# Patient Record
Sex: Female | Born: 1960 | Race: White | Hispanic: No | Smoking: Never smoker
Health system: Southern US, Community
[De-identification: ages and names within clinical notes are randomized; demographics above are authoritative.]

---

## 1997-09-23 ENCOUNTER — Inpatient Hospital Stay (HOSPITAL_COMMUNITY): Admission: AD | Admit: 1997-09-23 | Discharge: 1997-09-25 | Payer: Self-pay | Admitting: *Deleted

## 1997-11-08 ENCOUNTER — Other Ambulatory Visit: Admission: RE | Admit: 1997-11-08 | Discharge: 1997-11-08 | Payer: Self-pay | Admitting: Obstetrics and Gynecology

## 1998-09-18 ENCOUNTER — Other Ambulatory Visit: Admission: RE | Admit: 1998-09-18 | Discharge: 1998-09-18 | Payer: Self-pay | Admitting: *Deleted

## 1999-12-01 ENCOUNTER — Other Ambulatory Visit: Admission: RE | Admit: 1999-12-01 | Discharge: 1999-12-01 | Payer: Self-pay | Admitting: *Deleted

## 2000-07-14 ENCOUNTER — Other Ambulatory Visit: Admission: RE | Admit: 2000-07-14 | Discharge: 2000-07-14 | Payer: Self-pay | Admitting: *Deleted

## 2004-08-19 ENCOUNTER — Ambulatory Visit: Payer: Self-pay

## 2005-09-23 ENCOUNTER — Ambulatory Visit: Payer: Self-pay

## 2006-11-02 ENCOUNTER — Ambulatory Visit: Payer: Self-pay

## 2007-11-03 ENCOUNTER — Ambulatory Visit: Payer: Self-pay

## 2008-11-07 ENCOUNTER — Ambulatory Visit: Payer: Self-pay

## 2009-11-25 ENCOUNTER — Ambulatory Visit: Payer: Self-pay

## 2011-01-26 ENCOUNTER — Ambulatory Visit: Payer: Self-pay

## 2012-01-27 ENCOUNTER — Ambulatory Visit: Payer: Self-pay

## 2013-02-03 ENCOUNTER — Ambulatory Visit: Payer: Self-pay

## 2013-10-16 ENCOUNTER — Ambulatory Visit: Payer: Self-pay | Admitting: Gastroenterology

## 2014-02-05 ENCOUNTER — Ambulatory Visit: Payer: Self-pay

## 2015-02-07 ENCOUNTER — Other Ambulatory Visit: Payer: Self-pay | Admitting: Obstetrics and Gynecology

## 2015-02-07 DIAGNOSIS — Z1231 Encounter for screening mammogram for malignant neoplasm of breast: Secondary | ICD-10-CM

## 2015-02-19 ENCOUNTER — Ambulatory Visit
Admission: RE | Admit: 2015-02-19 | Discharge: 2015-02-19 | Disposition: A | Discharge status: Home / Self Care | Payer: BLUE CROSS/BLUE SHIELD | Source: Ambulatory Visit | Attending: Obstetrics and Gynecology | Admitting: Obstetrics and Gynecology

## 2015-02-19 DIAGNOSIS — Z1231 Encounter for screening mammogram for malignant neoplasm of breast: Secondary | ICD-10-CM | POA: Diagnosis not present

## 2016-01-29 ENCOUNTER — Other Ambulatory Visit: Payer: Self-pay | Admitting: Obstetrics and Gynecology

## 2016-01-29 DIAGNOSIS — Z1231 Encounter for screening mammogram for malignant neoplasm of breast: Secondary | ICD-10-CM

## 2016-02-26 ENCOUNTER — Ambulatory Visit
Admission: RE | Admit: 2016-02-26 | Discharge: 2016-02-26 | Disposition: A | Discharge status: Home / Self Care | Payer: Managed Care, Other (non HMO) | Source: Ambulatory Visit | Attending: Obstetrics and Gynecology | Admitting: Obstetrics and Gynecology

## 2016-02-26 DIAGNOSIS — Z1231 Encounter for screening mammogram for malignant neoplasm of breast: Secondary | ICD-10-CM | POA: Diagnosis present

## 2017-02-11 ENCOUNTER — Other Ambulatory Visit: Payer: Self-pay | Admitting: Obstetrics and Gynecology

## 2017-02-11 DIAGNOSIS — Z1231 Encounter for screening mammogram for malignant neoplasm of breast: Secondary | ICD-10-CM

## 2017-03-02 ENCOUNTER — Ambulatory Visit
Admission: RE | Admit: 2017-03-02 | Discharge: 2017-03-02 | Disposition: A | Discharge status: Home / Self Care | Payer: Managed Care, Other (non HMO) | Source: Ambulatory Visit | Attending: Obstetrics and Gynecology | Admitting: Obstetrics and Gynecology

## 2017-03-02 DIAGNOSIS — Z1231 Encounter for screening mammogram for malignant neoplasm of breast: Secondary | ICD-10-CM | POA: Diagnosis not present

## 2018-02-28 ENCOUNTER — Other Ambulatory Visit: Payer: Self-pay | Admitting: Obstetrics and Gynecology

## 2018-02-28 DIAGNOSIS — Z1231 Encounter for screening mammogram for malignant neoplasm of breast: Secondary | ICD-10-CM

## 2018-03-15 ENCOUNTER — Ambulatory Visit
Admission: RE | Admit: 2018-03-15 | Discharge: 2018-03-15 | Disposition: A | Discharge status: Home / Self Care | Payer: 59 | Source: Ambulatory Visit | Attending: Obstetrics and Gynecology | Admitting: Obstetrics and Gynecology

## 2018-03-15 DIAGNOSIS — Z1231 Encounter for screening mammogram for malignant neoplasm of breast: Secondary | ICD-10-CM | POA: Diagnosis present

## 2018-11-29 ENCOUNTER — Other Ambulatory Visit: Payer: Self-pay | Admitting: Family Medicine

## 2018-11-29 ENCOUNTER — Encounter
Admission: AD | Disposition: A | Discharge status: Home / Self Care | Payer: Self-pay | Source: Ambulatory Visit | Attending: General Surgery

## 2018-11-29 ENCOUNTER — Observation Stay
Admission: AD | Admit: 2018-11-29 | Discharge: 2018-11-30 | Disposition: A | Discharge status: Home / Self Care | Payer: Managed Care, Other (non HMO) | Source: Ambulatory Visit | Attending: General Surgery | Admitting: General Surgery

## 2018-11-29 ENCOUNTER — Ambulatory Visit
Admission: RE | Admit: 2018-11-29 | Discharge: 2018-11-29 | Disposition: A | Discharge status: Home / Self Care | Payer: Managed Care, Other (non HMO) | Source: Ambulatory Visit | Attending: Family Medicine | Admitting: Family Medicine

## 2018-11-29 ENCOUNTER — Observation Stay: Payer: Managed Care, Other (non HMO) | Admitting: Anesthesiology

## 2018-11-29 ENCOUNTER — Ambulatory Visit: Admit: 2018-11-29 | Payer: Managed Care, Other (non HMO) | Admitting: General Surgery

## 2018-11-29 ENCOUNTER — Other Ambulatory Visit: Payer: Self-pay

## 2018-11-29 DIAGNOSIS — R1030 Lower abdominal pain, unspecified: Secondary | ICD-10-CM | POA: Insufficient documentation

## 2018-11-29 DIAGNOSIS — K219 Gastro-esophageal reflux disease without esophagitis: Secondary | ICD-10-CM | POA: Diagnosis not present

## 2018-11-29 DIAGNOSIS — R109 Unspecified abdominal pain: Secondary | ICD-10-CM | POA: Diagnosis present

## 2018-11-29 DIAGNOSIS — F418 Other specified anxiety disorders: Secondary | ICD-10-CM | POA: Diagnosis not present

## 2018-11-29 DIAGNOSIS — K3580 Unspecified acute appendicitis: Secondary | ICD-10-CM | POA: Diagnosis not present

## 2018-11-29 DIAGNOSIS — Z1159 Encounter for screening for other viral diseases: Secondary | ICD-10-CM | POA: Insufficient documentation

## 2018-11-29 DIAGNOSIS — K353 Acute appendicitis with localized peritonitis, without perforation or gangrene: Secondary | ICD-10-CM | POA: Diagnosis present

## 2018-11-29 HISTORY — PX: LAPAROSCOPIC APPENDECTOMY: SHX408

## 2018-11-29 LAB — SARS CORONAVIRUS 2 BY RT PCR (HOSPITAL ORDER, PERFORMED IN ~~LOC~~ HOSPITAL LAB): SARS Coronavirus 2: NEGATIVE

## 2018-11-29 LAB — MRSA PCR SCREENING: MRSA by PCR: NEGATIVE

## 2018-11-29 SURGERY — APPENDECTOMY, LAPAROSCOPIC
Anesthesia: General

## 2018-11-29 MED ORDER — ROCURONIUM BROMIDE 100 MG/10ML IV SOLN
INTRAVENOUS | Status: DC | PRN
  Administered 2018-11-29: 10 mg via INTRAVENOUS
  Administered 2018-11-29: 20 mg via INTRAVENOUS

## 2018-11-29 MED ORDER — DEXAMETHASONE SODIUM PHOSPHATE 10 MG/ML IJ SOLN
INTRAMUSCULAR | Status: DC | PRN
  Administered 2018-11-29: 10 mg via INTRAVENOUS

## 2018-11-29 MED ORDER — METRONIDAZOLE IN NACL 5-0.79 MG/ML-% IV SOLN
500.0000 mg | Freq: Three times a day (TID) | INTRAVENOUS | Status: DC
  Administered 2018-11-29 – 2018-11-30 (×2): 500 mg via INTRAVENOUS
  Filled 2018-11-29 (×4): qty 100

## 2018-11-29 MED ORDER — ALPRAZOLAM 0.25 MG PO TABS: 0.2500 mg | ORAL_TABLET | Freq: Every evening | ORAL | Status: DC | PRN

## 2018-11-29 MED ORDER — LIDOCAINE HCL (CARDIAC) PF 100 MG/5ML IV SOSY
PREFILLED_SYRINGE | INTRAVENOUS | Status: DC | PRN
  Administered 2018-11-29: 100 mg via INTRAVENOUS

## 2018-11-29 MED ORDER — MIDAZOLAM HCL 2 MG/2ML IJ SOLN
INTRAMUSCULAR | Status: AC
  Filled 2018-11-29: qty 2

## 2018-11-29 MED ORDER — GLYCOPYRROLATE 0.2 MG/ML IJ SOLN
INTRAMUSCULAR | Status: DC | PRN
  Administered 2018-11-29: 0.2 mg via INTRAVENOUS

## 2018-11-29 MED ORDER — ROCURONIUM BROMIDE 50 MG/5ML IV SOLN
INTRAVENOUS | Status: AC
  Filled 2018-11-29: qty 1

## 2018-11-29 MED ORDER — ONDANSETRON HCL 4 MG/2ML IJ SOLN
INTRAMUSCULAR | Status: AC
  Filled 2018-11-29: qty 2

## 2018-11-29 MED ORDER — LIDOCAINE HCL (PF) 2 % IJ SOLN
INTRAMUSCULAR | Status: AC
  Filled 2018-11-29: qty 10

## 2018-11-29 MED ORDER — SUGAMMADEX SODIUM 500 MG/5ML IV SOLN
INTRAVENOUS | Status: DC | PRN
  Administered 2018-11-29: 140 mg via INTRAVENOUS

## 2018-11-29 MED ORDER — PAROXETINE HCL ER 12.5 MG PO TB24
25.0000 mg | ORAL_TABLET | Freq: Every day | ORAL | Status: DC
  Filled 2018-11-29 (×2): qty 2

## 2018-11-29 MED ORDER — PROPOFOL 10 MG/ML IV BOLUS
INTRAVENOUS | Status: DC | PRN
  Administered 2018-11-29: 140 mg via INTRAVENOUS

## 2018-11-29 MED ORDER — SUGAMMADEX SODIUM 200 MG/2ML IV SOLN
INTRAVENOUS | Status: AC
  Filled 2018-11-29: qty 2

## 2018-11-29 MED ORDER — ONDANSETRON 4 MG PO TBDP: 4.0000 mg | ORAL_TABLET | Freq: Four times a day (QID) | ORAL | Status: DC | PRN

## 2018-11-29 MED ORDER — PROPOFOL 10 MG/ML IV BOLUS
INTRAVENOUS | Status: AC
  Filled 2018-11-29: qty 20

## 2018-11-29 MED ORDER — ACETAMINOPHEN 10 MG/ML IV SOLN
INTRAVENOUS | Status: AC
  Filled 2018-11-29: qty 100

## 2018-11-29 MED ORDER — ACETAMINOPHEN 10 MG/ML IV SOLN
INTRAVENOUS | Status: DC | PRN
  Administered 2018-11-29: 1000 mg via INTRAVENOUS

## 2018-11-29 MED ORDER — SODIUM CHLORIDE 0.9 % IV SOLN
INTRAVENOUS | Status: DC
  Administered 2018-11-29: 19:00:00 via INTRAVENOUS

## 2018-11-29 MED ORDER — FENTANYL CITRATE (PF) 100 MCG/2ML IJ SOLN
INTRAMUSCULAR | Status: AC
  Filled 2018-11-29: qty 2

## 2018-11-29 MED ORDER — SEVOFLURANE IN SOLN
RESPIRATORY_TRACT | Status: AC
  Filled 2018-11-29: qty 250

## 2018-11-29 MED ORDER — ACETAMINOPHEN 650 MG RE SUPP: 650.0000 mg | Freq: Four times a day (QID) | RECTAL | Status: DC | PRN

## 2018-11-29 MED ORDER — FENTANYL CITRATE (PF) 100 MCG/2ML IJ SOLN
INTRAMUSCULAR | Status: DC | PRN
  Administered 2018-11-29 (×2): 50 ug via INTRAVENOUS

## 2018-11-29 MED ORDER — SUCCINYLCHOLINE CHLORIDE 20 MG/ML IJ SOLN
INTRAMUSCULAR | Status: DC | PRN
  Administered 2018-11-29: 100 mg via INTRAVENOUS

## 2018-11-29 MED ORDER — HYDROCODONE-ACETAMINOPHEN 5-325 MG PO TABS
1.0000 | ORAL_TABLET | ORAL | Status: DC | PRN
  Administered 2018-11-29 – 2018-11-30 (×3): 2 via ORAL
  Filled 2018-11-29 (×3): qty 2

## 2018-11-29 MED ORDER — ENOXAPARIN SODIUM 40 MG/0.4ML ~~LOC~~ SOLN: 40.0000 mg | Freq: Every day | SUBCUTANEOUS | Status: DC

## 2018-11-29 MED ORDER — MORPHINE SULFATE (PF) 2 MG/ML IV SOLN: 2.0000 mg | INTRAVENOUS | Status: DC | PRN

## 2018-11-29 MED ORDER — ONDANSETRON HCL 4 MG/2ML IJ SOLN
INTRAMUSCULAR | Status: DC | PRN
  Administered 2018-11-29: 4 mg via INTRAVENOUS

## 2018-11-29 MED ORDER — ACETAMINOPHEN 325 MG PO TABS
650.0000 mg | ORAL_TABLET | Freq: Four times a day (QID) | ORAL | Status: DC | PRN
  Administered 2018-11-30: 09:00:00 650 mg via ORAL
  Filled 2018-11-29: qty 2

## 2018-11-29 MED ORDER — SODIUM CHLORIDE 0.9 % IV SOLN
2.0000 g | Freq: Every day | INTRAVENOUS | Status: DC
  Administered 2018-11-29: 19:00:00 2 g via INTRAVENOUS
  Filled 2018-11-29: qty 2
  Filled 2018-11-29: qty 20

## 2018-11-29 MED ORDER — LACTATED RINGERS IV SOLN
INTRAVENOUS | Status: DC | PRN
  Administered 2018-11-29: 21:00:00 via INTRAVENOUS

## 2018-11-29 MED ORDER — ONDANSETRON HCL 4 MG/2ML IJ SOLN: 4.0000 mg | Freq: Four times a day (QID) | INTRAMUSCULAR | Status: DC | PRN

## 2018-11-29 MED ORDER — IOHEXOL 300 MG/ML  SOLN
100.0000 mL | Freq: Once | INTRAMUSCULAR | Status: AC | PRN
  Administered 2018-11-29: 100 mL via INTRAVENOUS

## 2018-11-29 MED ORDER — BUPIVACAINE-EPINEPHRINE 0.5% -1:200000 IJ SOLN
INTRAMUSCULAR | Status: DC | PRN
  Administered 2018-11-29: 17 mL
  Administered 2018-11-29: 13 mL

## 2018-11-29 MED ORDER — SUCCINYLCHOLINE CHLORIDE 20 MG/ML IJ SOLN
INTRAMUSCULAR | Status: AC
  Filled 2018-11-29: qty 1

## 2018-11-29 SURGICAL SUPPLY — 45 items
ADH SKN CLS APL DERMABOND .7 (GAUZE/BANDAGES/DRESSINGS) ×1
APL PRP STRL LF DISP 70% ISPRP (MISCELLANEOUS) ×1
APPLIER CLIP LOGIC TI 5 (MISCELLANEOUS) IMPLANT
APR CLP MED LRG 33X5 (MISCELLANEOUS)
BLADE SURG SZ11 CARB STEEL (BLADE) ×3 IMPLANT
CANISTER SUCT 1200ML W/VALVE (MISCELLANEOUS) ×3 IMPLANT
CHLORAPREP W/TINT 26 (MISCELLANEOUS) ×3 IMPLANT
COVER WAND RF STERILE (DRAPES) ×1 IMPLANT
CUTTER FLEX LINEAR 45M (STAPLE) ×3 IMPLANT
DERMABOND ADVANCED (GAUZE/BANDAGES/DRESSINGS) ×2
DERMABOND ADVANCED .7 DNX12 (GAUZE/BANDAGES/DRESSINGS) ×1 IMPLANT
ELECT REM PT RETURN 9FT ADLT (ELECTROSURGICAL) ×3
ELECTRODE REM PT RTRN 9FT ADLT (ELECTROSURGICAL) ×1 IMPLANT
GLOVE BIO SURGEON STRL SZ 6.5 (GLOVE) ×3 IMPLANT
GLOVE BIO SURGEONS STRL SZ 6.5 (GLOVE) ×2
GLOVE INDICATOR 6.5 STRL GRN (GLOVE) ×5 IMPLANT
GOWN STRL REUS W/ TWL LRG LVL3 (GOWN DISPOSABLE) ×3 IMPLANT
GOWN STRL REUS W/TWL LRG LVL3 (GOWN DISPOSABLE) ×6
GRASPER SUT TROCAR 14GX15 (MISCELLANEOUS) ×3 IMPLANT
HANDLE YANKAUER SUCT BULB TIP (MISCELLANEOUS) ×3 IMPLANT
IRRIGATION STRYKERFLOW (MISCELLANEOUS) IMPLANT
IRRIGATOR STRYKERFLOW (MISCELLANEOUS) ×3
IV NS 1000ML (IV SOLUTION) ×6
IV NS 1000ML BAXH (IV SOLUTION) ×1 IMPLANT
KIT TURNOVER KIT A (KITS) ×3 IMPLANT
LIGASURE LAP MARYLAND 5MM 37CM (ELECTROSURGICAL) ×2 IMPLANT
NEEDLE HYPO 22GX1.5 SAFETY (NEEDLE) ×3 IMPLANT
NEEDLE VERESS 14GA 120MM (NEEDLE) ×3 IMPLANT
NS IRRIG 500ML POUR BTL (IV SOLUTION) ×3 IMPLANT
PACK LAP CHOLECYSTECTOMY (MISCELLANEOUS) ×3 IMPLANT
POUCH ENDO CATCH 10MM SPEC (MISCELLANEOUS) ×3 IMPLANT
RELOAD 45 VASCULAR/THIN (ENDOMECHANICALS) IMPLANT
RELOAD STAPLE 45 2.5 WHT GRN (ENDOMECHANICALS) IMPLANT
RELOAD STAPLE 45 3.5 BLU ETS (ENDOMECHANICALS) ×1 IMPLANT
RELOAD STAPLE TA45 3.5 REG BLU (ENDOMECHANICALS) ×3 IMPLANT
SCISSORS METZENBAUM CVD 33 (INSTRUMENTS) ×1 IMPLANT
SET TUBE SMOKE EVAC HIGH FLOW (TUBING) ×3 IMPLANT
SLEEVE ENDOPATH XCEL 5M (ENDOMECHANICALS) ×3 IMPLANT
SPONGE GAUZE 2X2 8PLY STER LF (GAUZE/BANDAGES/DRESSINGS) ×1
SPONGE GAUZE 2X2 8PLY STRL LF (GAUZE/BANDAGES/DRESSINGS) ×2 IMPLANT
SUT MNCRL AB 4-0 PS2 18 (SUTURE) ×3 IMPLANT
SUT VICRYL PLUS ABS 0 54 (SUTURE) ×3 IMPLANT
TRAY FOLEY MTR SLVR 16FR STAT (SET/KITS/TRAYS/PACK) ×3 IMPLANT
TROCAR XCEL 12X100 BLDLESS (ENDOMECHANICALS) ×3 IMPLANT
TROCAR XCEL NON-BLD 5MMX100MML (ENDOMECHANICALS) ×3 IMPLANT

## 2018-11-29 NOTE — H&P (Signed)
PATIENT PROFILE: Jillian Hunter is a 57 y.o. female who presents to the Clinic for consultation at the request of Dr. Linthavong for evaluation of acute appendicitis.  PCP:  Linthavong, [place], MD  HISTORY OF PRESENT ILLNESS: Ms. Jillian Hunter reports he started with abdominal pain 2 days ago.  The pain was localized on the right lower quadrant.  The pain did not radiate to other part of the body.  There was no alleviating or aggravating factor.  Patient was very uncomfortable and was not able to find position to improve the pain.  Yesterday and today the pain slightly improved but has been on the right lower quadrant of the time.  She reports nausea but denies vomiting.  She denies fever or chills.   PROBLEM LIST: Problem List  Date Reviewed: 11/29/2018         Noted   Borderline diabetes mellitus (A1c 5.9% - 09/02/18) - diet controlled 09/19/2018   B12 deficiency (324 - 09/02/18) 09/19/2018   Pure hypercholesterolemia (LDL 139 - 09/02/18) - diet controlled 09/17/2017   Vaccine counseling: Tetanus vaccine administered in 2011 (09/16/17) 09/12/2015   Depression with anxiety - meds prescribed by Dr. Schermerhorn 08/31/2013      GENERAL REVIEW OF SYSTEMS:   General ROS: negative for - chills, fatigue, fever, weight gain or weight loss Allergy and Immunology ROS: negative for - hives  Hematological and Lymphatic ROS: negative for - bleeding problems or bruising, negative for palpable nodes Endocrine ROS: negative for - heat or cold intolerance, hair changes Respiratory ROS: negative for - cough, shortness of breath or wheezing Cardiovascular ROS: no chest pain or palpitations GI ROS: negative for vomiting, diarrhea, constipation.  Setting for abdominal pain and nausea. Musculoskeletal ROS: negative for - joint swelling or muscle pain Neurological ROS: negative for - confusion, syncope Dermatological ROS: negative for pruritus and rash Psychiatric: negative for anxiety, depression, difficulty sleeping and  memory loss  MEDICATIONS: Current Outpatient Medications  Medication Sig Dispense Refill  . ALPRAZolam (XANAX) 0.25 MG tablet Take 1 tablet (0.25 mg total) by mouth nightly as needed 30 tablet 0  . cyanocobalamin (VITAMIN B12) 1000 MCG tablet Take 1,000 mcg by mouth once daily    . docusate sodium (STOOL SOFTENER ORAL) Take 1 capsule by mouth every other day    . IBUPROFEN/DIPHENHYDRAMINE CIT (IBUPROFEN PM ORAL) Take 2 tablets by mouth nightly as needed.    . magnesium 250 mg Tab Take by mouth once daily    . multivitamin tablet Take 1 tablet by mouth once daily    . PARoxetine (PAXIL-CR) 25 MG CR tablet Take 1 tablet (25 mg total) by mouth once daily 90 tablet 3   No current facility-administered medications for this visit.     ALLERGIES: Patient has no known allergies.  PAST MEDICAL HISTORY: Past Medical History:  Diagnosis Date  . Abnormal cytology   . Anxiety   . Depression   . Papanicolaou smear of cervix with low grade squamous intraepithelial lesion (LGSIL) 12/29/2011   Colposcopy 01/15/2012 = benign  . Pure hypercholesterolemia (LDL 157 - 09/17/17)  09/17/2017    PAST SURGICAL HISTORY: History reviewed. No pertinent surgical history.   FAMILY HISTORY: Family History  Problem Relation Age of Onset  . Myocardial Infarction (Heart attack) Father   . Alcohol abuse Father   . Osteoporosis (Thinning of bones) Mother   . Stroke Mother      SOCIAL HISTORY: Social History   Socioeconomic History  . Marital status: Married      Spouse name: Not on file  . Number of children: Not on file  . Years of education: Not on file  . Highest education level: Not on file  Occupational History  . Not on file  Social Needs  . Financial resource strain: Not on file  . Food insecurity:    Worry: Not on file    Inability: Not on file  . Transportation needs:    Medical: Not on file    Non-medical: Not on file  Tobacco Use  . Smoking status: Never Smoker  . Smokeless tobacco:  Never Used  Substance and Sexual Activity  . Alcohol use: Yes    Alcohol/week: 0.0 standard drinks    Comment: occasional  . Drug use: No  . Sexual activity: Not Currently    Partners: Male    Birth control/protection: Post-menopausal  Other Topics Concern  . Not on file  Social History Narrative  . Not on file    PHYSICAL EXAM: Vitals:   11/29/18 1604  BP: 156/73  Pulse: 81   Body mass index is 24.3 kg/m. Weight: 66.2 kg (146 lb)   GENERAL: Alert, active, oriented x3  HEENT: Pupils equal reactive to light. Extraocular movements are intact. Sclera clear. Palpebral conjunctiva normal red color.  NECK: Supple with no palpable mass and no adenopathy.  LUNGS: Sound clear with no rales rhonchi or wheezes.  HEART: Regular rhythm S1 and S2 without murmur.  ABDOMEN: Tender to palpation in the right lower quadrant.  Soft and depressible, nondistended.  No scars.  EXTREMITIES: Well-developed well-nourished symmetrical with no dependent edema.  NEUROLOGICAL: Awake alert oriented, facial expression symmetrical, moving all extremities.  REVIEW OF DATA: I have reviewed the following data today: Office Visit on 11/29/2018  Component Date Value  . WBC (White Blood Cell Co* 11/29/2018 6.3   . RBC (Red Blood Cell Coun* 11/29/2018 4.42   . Hemoglobin 11/29/2018 13.6   . Hematocrit 11/29/2018 40.9   . MCV (Mean Corpuscular Vo* 11/29/2018 92.5   . MCH (Mean Corpuscular He* 11/29/2018 30.8   . MCHC (Mean Corpuscular H* 11/29/2018 33.3   . Platelet Count 11/29/2018 258   . RDW-CV (Red Cell Distrib* 11/29/2018 11.9   . MPV (Mean Platelet Volum* 11/29/2018 10.1   . Neutrophils 11/29/2018 3.66   . Lymphocytes 11/29/2018 1.93   . Monocytes 11/29/2018 0.46   . Eosinophils 11/29/2018 0.17   . Basophils 11/29/2018 0.04   . Neutrophil % 11/29/2018 58.4   . Lymphocyte % 11/29/2018 30.8   . Monocyte % 11/29/2018 7.3   . Eosinophil % 11/29/2018 2.7   . Basophil% 11/29/2018 0.6   .  Immature Granulocyte % 11/29/2018 0.2   . Immature Granulocyte Cou* 11/29/2018 0.01   . Glucose 11/29/2018 95   . Sodium 11/29/2018 141   . Potassium 11/29/2018 3.5*  . Chloride 11/29/2018 103   . Carbon Dioxide (CO2) 11/29/2018 29.4   . Urea Nitrogen (BUN) 11/29/2018 11   . Creatinine 11/29/2018 0.7   . Glomerular Filtration Ra* 11/29/2018 87   . Calcium 11/29/2018 9.5   . AST  11/29/2018 20   . ALT  11/29/2018 18   . Alk Phos (alkaline Phosp* 11/29/2018 81   . Albumin 11/29/2018 4.5   . Bilirubin, Total 11/29/2018 0.6   . Protein, Total 11/29/2018 7.3   . A/G Ratio 11/29/2018 1.6   . Color 11/29/2018 Yellow   . Clarity 11/29/2018 Clear   . Specific Gravity 11/29/2018 1.015   . pH, Urine 11/29/2018   6.0   . Protein, Urinalysis 11/29/2018 Negative   . Glucose, Urinalysis 11/29/2018 Negative   . Ketones, Urinalysis 11/29/2018 Trace*  . Blood, Urinalysis 11/29/2018 Negative   . Nitrite, Urinalysis 11/29/2018 Negative   . Leukocyte Esterase, Urin* 11/29/2018 Small*  . White Blood Cells, Urina* 11/29/2018 0-3   . Red Blood Cells, Urinaly* 11/29/2018 0-3   . Bacteria, Urinalysis 11/29/2018 Rare*  . Squamous Epithelial Cell* 11/29/2018 Few   Appointment on 09/02/2018  Component Date Value  . Glucose 09/02/2018 103   . Sodium 09/02/2018 143   . Potassium 09/02/2018 3.7   . Chloride 09/02/2018 105   . Carbon Dioxide (CO2) 09/02/2018 27.1   . Urea Nitrogen (BUN) 09/02/2018 7   . Creatinine 09/02/2018 0.7   . Glomerular Filtration Ra* 09/02/2018 87   . Calcium 09/02/2018 9.5   . AST  09/02/2018 19   . ALT  09/02/2018 16   . Alk Phos (alkaline Phosp* 09/02/2018 62   . Albumin 09/02/2018 4.3   . Bilirubin, Total 09/02/2018 0.7   . Protein, Total 09/02/2018 7.0   . A/G Ratio 09/02/2018 1.6   . Hemoglobin A1C 09/02/2018 5.9*  . Average Blood Glucose (C* 09/02/2018 123   . Cholesterol, Total 09/02/2018 214*  . Triglyceride 09/02/2018 137   . HDL (High Density Lipopr* 09/02/2018  48.0   . LDL Calculated 09/02/2018 139*  . VLDL Cholesterol 09/02/2018 27   . Cholesterol/HDL Ratio 09/02/2018 4.5   . WBC (White Blood Cell Co* 09/02/2018 5.3   . RBC (Red Blood Cell Coun* 09/02/2018 4.40   . Hemoglobin 09/02/2018 13.4   . Hematocrit 09/02/2018 40.9   . MCV (Mean Corpuscular Vo* 09/02/2018 93.0   . MCH (Mean Corpuscular He* 09/02/2018 30.5   . MCHC (Mean Corpuscular H* 09/02/2018 32.8   . Platelet Count 09/02/2018 281   . RDW-CV (Red Cell Distrib* 09/02/2018 12.0   . MPV (Mean Platelet Volum* 09/02/2018 10.1   . Neutrophils 09/02/2018 2.60   . Lymphocytes 09/02/2018 2.11   . Monocytes 09/02/2018 0.36   . Eosinophils 09/02/2018 0.17   . Basophils 09/02/2018 0.04   . Neutrophil % 09/02/2018 49.1   . Lymphocyte % 09/02/2018 39.9   . Monocyte % 09/02/2018 6.8   . Eosinophil % 09/02/2018 3.2   . Basophil% 09/02/2018 0.8   . Immature Granulocyte % 09/02/2018 0.2   . Immature Granulocyte Cou* 09/02/2018 0.01   . Vitamin B12 09/02/2018 324      ASSESSMENT: Ms. Touchet is a 57 y.o. female presenting for consultation for acute appendicitis.   Patient with history, physical exam and images consistent with acute appendicitis. Patient oriented about diagnosis and surgical management as treatment. Patient oriented about goals of surgery and its risk including: bowel injury, infection, abscess, bleeding, leak from cecum, intestinal adhesions, bowel obstruction, fistula, injury to the ureter among others.  Patient understood and agreed to proceed with surgery. Will admit patient, already started on antibiotic therapy, will give IV hydration since patient is NPO and schedule to OR.    Acute appendicitis with localized peritonitis, without perforation, abscess, or gangrene [K35.30]  PLAN: 1. Admit to hospital for laparoscopic appendectomy  Patient and her sister verbalized understanding, all questions were answered, and were agreeable with the plan outlined above.   Edgardo  Cintron-Diaz, MD  Electronically signed by Edgardo Cintron-Diaz, MD  

## 2018-11-29 NOTE — Anesthesia Preprocedure Evaluation (Signed)
Anesthesia Evaluation  Patient identified by MRN, date of birth, ID band Patient awake    Reviewed: Allergy & Precautions, H&P , NPO status , Patient's Chart, lab work & pertinent test results  History of Anesthesia Complications Negative for: history of anesthetic complications  Airway Mallampati: III  TM Distance: >3 FB Neck ROM: full    Dental  (+) Chipped   Pulmonary neg pulmonary ROS, neg shortness of breath,           Cardiovascular Exercise Tolerance: Good (-) angina(-) Past MI and (-) DOE negative cardio ROS       Neuro/Psych negative neurological ROS  negative psych ROS   GI/Hepatic Neg liver ROS, GERD  Medicated,  Endo/Other  negative endocrine ROS  Renal/GU      Musculoskeletal   Abdominal   Peds  Hematology negative hematology ROS (+)   Anesthesia Other Findings   BMI    Body Mass Index: 24.91 kg/m      Reproductive/Obstetrics negative OB ROS                             Anesthesia Physical Anesthesia Plan  ASA: II  Anesthesia Plan: General ETT   Post-op Pain Management:    Induction: Intravenous  PONV Risk Score and Plan: Ondansetron, Dexamethasone, Midazolam and Treatment may vary due to age or medical condition  Airway Management Planned: Oral ETT  Additional Equipment:   Intra-op Plan:   Post-operative Plan: Extubation in OR  Informed Consent: I have reviewed the patients History and Physical, chart, labs and discussed the procedure including the risks, benefits and alternatives for the proposed anesthesia with the patient or authorized representative who has indicated his/her understanding and acceptance.     Dental Advisory Given  Plan Discussed with: Anesthesiologist, CRNA and Surgeon  Anesthesia Plan Comments: (Patient consented for risks of anesthesia including but not limited to:  - adverse reactions to medications - damage to teeth, lips  or other oral mucosa - sore throat or hoarseness - Damage to heart, brain, lungs or loss of life  Patient voiced understanding.)        Anesthesia Quick Evaluation

## 2018-11-29 NOTE — Anesthesia Procedure Notes (Signed)
Procedure Name: Intubation Date/Time: 11/29/2018 9:15 PM Performed by: Lendon Colonel, CRNA Pre-anesthesia Checklist: Patient identified, Patient being monitored, Timeout performed, Emergency Drugs available and Suction available Patient Re-evaluated:Patient Re-evaluated prior to induction Oxygen Delivery Method: Circle system utilized Preoxygenation: Pre-oxygenation with 100% oxygen Induction Type: IV induction and Rapid sequence Laryngoscope Size: Miller and 2 Grade View: Grade I Tube type: Oral Tube size: 7.0 mm Number of attempts: 1 Airway Equipment and Method: Stylet Placement Confirmation: ETT inserted through vocal cords under direct vision,  positive ETCO2 and breath sounds checked- equal and bilateral Secured at: 20 cm Tube secured with: Tape Dental Injury: Teeth and Oropharynx as per pre-operative assessment

## 2018-11-29 NOTE — Op Note (Signed)
Preoperative diagnosis: Acute appendicitis.  Postoperative diagnosis: Acute appendicitis  Procedure: Laparoscopic appendectomy.  Anesthesia: GETA  Surgeon: Dr. Windell Moment, MD  Wound Classification: Contaminated  Indications: Patient is a 58 y.o. female  presented with right lower quadrant pain of 2 days of duration, chills. Computed tomography scan and physical examination were consistent with acute appendicitis.   Findings: 1. Acutely inflamed appendix 2. No peri-appendiceal abscess or phlegmon 3. Normal anatomy 4. Adequate hemostasis.   Description of procedure: The patient was placed on the operating table in the supine position. General anesthesia was induced. A time-out was completed verifying correct patient, procedure, site, positioning, and implant(s) and/or special equipment prior to beginning this procedure. A Foley catheter and orogastric tubes were placed. The abdomen was prepped and draped in the usual sterile fashion.  An incision was made in a natural skin line above the umbilicus.   The fascia was elevated and the Veress needle inserted. Proper position was confirmed by aspiration and saline meniscus test. The abdomen was insufflated with carbon dioxide to a pressure of 15 mmHg. The patient tolerated insufflation well. A 5-mm optiview trocar was then inserted supraumbilically. The laparoscope was inserted and the abdomen inspected. No injuries from initial trocar placement were noted. Turbid fluid was noted in the right lower quadrant. Under direct visualization, an 12-mm trocar was inserted in the left lower quadrant lateral to the rectus muscle. A 5-mm port was then placed above the symphysis pubis on midline.  Care was taken to avoid injury to the bladder or inferior epigastric vessels. The table was placed in the Trendelenburg position with the right side elevated.  The cecum was gently grasped with an endoscopic graspers and pulled toward (the left upper quadrant). An  atraumatic grasper was then passed through the suprapubic port and omentum was dissected away until the appendix was identified. The appendix was then grasped and elevated. It was noted to be inflamed.  An endoscopic linear cutting stapler was then used to divide and staple the base of the appendix.  The mesoappendix was divided with the LigaSure device.  The appendix was placed in an endoscopic retrieval bag and removed.  The appendiceal stump was then irrigated and hemostasis was assured. Fluid was suctioned and no other pathology was identified.  Secondary trocars were removed under direct vision. No bleeding was noted. The laparoscope was withdrawn and the umbilical trocar removed. The abdomen was allowed to collapse. All trocar sites greater than 5 mm were closed with Vicryl 0. The skin was closed with subcuticular sutures Monocryl 3-0 of and steristrips.  The patient tolerated the procedure well and was taken to the postanesthesia care unit in satisfactory condition.   Specimen: Appendix  Complications: None  Estimated Blood Loss: 5 mL

## 2018-11-29 NOTE — Transfer of Care (Signed)
Immediate Anesthesia Transfer of Care Note  Patient: Jillian Hunter  Procedure(s) Performed: APPENDECTOMY LAPAROSCOPIC (N/A )  Patient Location: PACU  Anesthesia Type:General  Level of Consciousness: awake, alert , oriented and patient cooperative  Airway & Oxygen Therapy: Patient Spontanous Breathing and Patient connected to face mask oxygen  Post-op Assessment: Report given to RN and Post -op Vital signs reviewed and stable  Post vital signs: Reviewed and stable  Last Vitals:  Vitals Value Taken Time  BP 119/90 11/29/18 2217  Temp    Pulse 99 11/29/18 2219  Resp 17 11/29/18 2219  SpO2 100 % 11/29/18 2219  Vitals shown include unvalidated device data.  Last Pain:  Vitals:   11/29/18 2048  TempSrc:   PainSc: 1          Complications: No apparent anesthesia complications

## 2018-11-29 NOTE — Anesthesia Post-op Follow-up Note (Signed)
Anesthesia QCDR form completed.        

## 2018-11-30 ENCOUNTER — Encounter: Payer: Self-pay | Admitting: General Surgery

## 2018-11-30 DIAGNOSIS — K3580 Unspecified acute appendicitis: Secondary | ICD-10-CM | POA: Diagnosis not present

## 2018-11-30 MED ORDER — HYDROCODONE-ACETAMINOPHEN 5-325 MG PO TABS: 1.0000 | ORAL_TABLET | ORAL | 0 refills | Status: AC | PRN

## 2018-11-30 NOTE — Progress Notes (Signed)
Nsg Discharge Note  Admit Date:  11/29/2018 Discharge date: 11/30/2018   Otelia Santee to be D/C'd Home per MD order.  AVS completed.  Copy for chart, and copy for patient signed, and dated. Patient/caregiver able to verbalize understanding.  Discharge Medication: Allergies as of 11/30/2018   No Known Allergies     Medication List    TAKE these medications   HYDROcodone-acetaminophen 5-325 MG tablet Commonly known as: Norco Take 1 tablet by mouth every 4 (four) hours as needed for up to 3 days for moderate pain.       Discharge Assessment: Vitals:   11/30/18 0047 11/30/18 0415  BP: (!) 107/59 115/62  Pulse: 61 63  Resp: 18   Temp: 98.3 F (36.8 C) (!) 97.5 F (36.4 C)  SpO2: 98% 98%   Skin clean, dry and intact without evidence of skin break down, no evidence of skin tears noted. IV catheter discontinued intact. Site without signs and symptoms of complications - no redness or edema noted at insertion site, patient denies c/o pain - only slight tenderness at site.  Dressing with slight pressure applied.  D/c Instructions-Education: Discharge instructions given to patient/family with verbalized understanding. D/c education completed with patient/family including follow up instructions, medication list, d/c activities limitations if indicated, with other d/c instructions as indicated by MD - patient able to verbalize understanding, all questions fully answered. Patient instructed to return to ED, call 911, or call MD for any changes in condition.  Patient escorted via [place], and D/C home via private auto.  Eda Keys, RN 11/30/2018 11:17 AM

## 2018-11-30 NOTE — Discharge Summary (Signed)
  Patient ID: Jillian Hunter MRN: [id number] DOB/AGE: 01-11-1961 58 y.o.  Admit date: 11/29/2018 Discharge date: 11/30/2018   Discharge Diagnoses:  Active Problems:   Acute appendicitis with localized peritonitis   Procedures: Laparoscopic appendectomy  Hospital Course: Patient admitted with acute appendicitis.  Patient underwent laparoscopic appendectomy last night.  She tolerated the procedure well.  Today patient has had breakfast, passing gas, pain control and ambulating.  She does not have any issues with the wound.  Physical Exam  Constitutional: She is well-developed, well-nourished, and in no distress.  Cardiovascular: Normal rate and regular rhythm.  Pulmonary/Chest: Effort normal.  Abdominal: Soft. She exhibits no distension. There is no abdominal tenderness.  Wounds are dry and clean.  Small bruise on umbilical and left lower quadrant wound.   Consults: None  Disposition: Discharge disposition: 01-Home or Self Care       Discharge Instructions    Diet - low sodium heart healthy   Complete by: As directed      Allergies as of 11/30/2018   No Known Allergies     Medication List    TAKE these medications   HYDROcodone-acetaminophen 5-325 MG tablet Commonly known as: Norco Take 1 tablet by mouth every 4 (four) hours as needed for up to 3 days for moderate pain.      Follow-up Information    Herbert Pun, MD Follow up in 2 week(s).   Specialty: General Surgery Contact information: [address] [place] [address] [phone]

## 2018-11-30 NOTE — Discharge Instructions (Signed)

## 2018-12-01 LAB — SURGICAL PATHOLOGY

## 2018-12-01 NOTE — Anesthesia Postprocedure Evaluation (Signed)
Anesthesia Post Note  Patient: [place]  Procedure(s) Performed: APPENDECTOMY LAPAROSCOPIC (N/A )  Patient location during evaluation: PACU Anesthesia Type: General Level of consciousness: awake and alert Pain management: pain level controlled Vital Signs Assessment: post-procedure vital signs reviewed and stable Respiratory status: spontaneous breathing, nonlabored ventilation, respiratory function stable and patient connected to nasal cannula oxygen Cardiovascular status: blood pressure returned to baseline and stable Postop Assessment: no apparent nausea or vomiting Anesthetic complications: no     Last Vitals:  Vitals:   11/30/18 0047 11/30/18 0415  BP: (!) 107/59 115/62  Pulse: 61 63  Resp: 18   Temp: 36.8 C (!) 36.4 C  SpO2: 98% 98%    Last Pain:  Vitals:   11/30/18 0602  TempSrc:   PainSc: Asleep                 Precious Haws Leveta Wahab

## 2019-02-28 ENCOUNTER — Other Ambulatory Visit: Payer: Self-pay | Admitting: Obstetrics and Gynecology

## 2019-02-28 DIAGNOSIS — Z1231 Encounter for screening mammogram for malignant neoplasm of breast: Secondary | ICD-10-CM

## 2019-03-08 DIAGNOSIS — E7800 Pure hypercholesterolemia, unspecified: Secondary | ICD-10-CM | POA: Insufficient documentation

## 2019-04-13 ENCOUNTER — Ambulatory Visit
Admission: RE | Admit: 2019-04-13 | Discharge: 2019-04-13 | Disposition: A | Discharge status: Home / Self Care | Payer: Managed Care, Other (non HMO) | Source: Ambulatory Visit | Attending: Obstetrics and Gynecology | Admitting: Obstetrics and Gynecology

## 2019-04-13 DIAGNOSIS — Z1231 Encounter for screening mammogram for malignant neoplasm of breast: Secondary | ICD-10-CM | POA: Insufficient documentation

## 2020-02-28 ENCOUNTER — Other Ambulatory Visit
Admission: RE | Admit: 2020-02-28 | Discharge: 2020-02-28 | Disposition: A | Discharge status: Home / Self Care | Payer: Managed Care, Other (non HMO) | Source: Ambulatory Visit | Attending: Family Medicine | Admitting: Family Medicine

## 2020-02-28 DIAGNOSIS — R5383 Other fatigue: Secondary | ICD-10-CM | POA: Insufficient documentation

## 2020-02-28 LAB — FIBRIN DERIVATIVES D-DIMER (ARMC ONLY): Fibrin derivatives D-dimer (ARMC): 224.33 ng/mL (FEU) (ref 0.00–499.00)

## 2020-03-14 ENCOUNTER — Other Ambulatory Visit: Payer: Self-pay | Admitting: Family Medicine

## 2020-03-14 DIAGNOSIS — Z1231 Encounter for screening mammogram for malignant neoplasm of breast: Secondary | ICD-10-CM

## 2020-04-11 ENCOUNTER — Ambulatory Visit: Payer: Managed Care, Other (non HMO) | Admitting: Dermatology

## 2020-04-11 ENCOUNTER — Other Ambulatory Visit: Payer: Self-pay

## 2020-04-11 ENCOUNTER — Encounter: Payer: Self-pay | Admitting: Dermatology

## 2020-04-11 DIAGNOSIS — L700 Acne vulgaris: Secondary | ICD-10-CM | POA: Diagnosis not present

## 2020-04-11 DIAGNOSIS — D229 Melanocytic nevi, unspecified: Secondary | ICD-10-CM

## 2020-04-11 DIAGNOSIS — Z1283 Encounter for screening for malignant neoplasm of skin: Secondary | ICD-10-CM | POA: Diagnosis not present

## 2020-04-11 DIAGNOSIS — L814 Other melanin hyperpigmentation: Secondary | ICD-10-CM | POA: Diagnosis not present

## 2020-04-11 DIAGNOSIS — D1800 Hemangioma unspecified site: Secondary | ICD-10-CM

## 2020-04-11 DIAGNOSIS — L821 Other seborrheic keratosis: Secondary | ICD-10-CM

## 2020-04-11 DIAGNOSIS — D225 Melanocytic nevi of trunk: Secondary | ICD-10-CM | POA: Diagnosis not present

## 2020-04-11 DIAGNOSIS — L578 Other skin changes due to chronic exposure to nonionizing radiation: Secondary | ICD-10-CM

## 2020-04-11 MED ORDER — SPIRONOLACTONE 100 MG PO TABS: 100.0000 mg | ORAL_TABLET | Freq: Every day | ORAL | 2 refills | Status: DC

## 2020-04-11 NOTE — Progress Notes (Signed)
Follow-Up Visit   Subjective  Jillian Hunter is a 59 y.o. female who presents for the following: Annual Exam (Pt here for a TBSE. Pts pcp found a mole on her abdomen in the RUQ below the breast that needs checked. ).  Patient here for full body skin exam and skin cancer screening.  The following portions of the chart were reviewed this encounter and updated as appropriate: Tobacco  Allergies  Meds  Problems  Med Hx  Surg Hx  Fam Hx      Review of Systems: No other skin or systemic complaints except as noted in HPI or Assessment and Plan.   Objective  Well appearing patient in no apparent distress; mood and affect are within normal limits.  A full examination was performed including scalp, head, eyes, ears, nose, lips, neck, chest, axillae, abdomen, back, buttocks, bilateral upper extremities, bilateral lower extremities, hands, feet, fingers, toes, fingernails, and toenails. All findings within normal limits unless otherwise noted below.  Objective  Left Breast 7 o'clock, Right upper abdomen: 0.4 cm medium to dark brown fried egg pattern with perifollicular drop-out at L breast 7 o'clock  0.7 cm tan soft papule at the right upper abdomen  Objective  face: Several excoriated inflamed papules and cysts along jaw.  Assessment & Plan  Nevus (2) Left Breast 7 o'clock; Right upper abdomen  Benign-appearing.  Observation.  Call clinic for new or changing moles.  Recommend daily use of broad spectrum spf 30+ sunscreen to sun-exposed areas.    Acne vulgaris face  Chronic, flared. Likely hormonal given distribution.  She has failed topicals such as clindamycin  Reviewed CMP, potassium normal  Start Spironolactone 100 mg. Take 1 tablet at night.    Spironolactone can cause increased urination and cause blood pressure to decrease. Please watch for signs of lightheadedness and be cautious when changing position. It can sometimes cause breast tenderness or an irregular  period in premenopausal women. It can also increase potassium. The increase in potassium usually is not a concern unless you are taking other medicines that also increase potassium, so please be sure your doctor knows all of the other medications you are taking. This medication should not be taken by pregnant women.  This medicine should also not be taken together with sulfa drugs like Bactrim (trimethoprim/sulfamethexazole).    Other Related Procedures Potassium  Ordered Medications: spironolactone (ALDACTONE) 100 MG tablet   Lentigines - Scattered tan macules - Discussed due to sun exposure - Benign, observe - Call for any changes  Seborrheic Keratoses - Stuck-on, waxy, tan-brown papules and plaques  - Discussed benign etiology and prognosis. - Observe - Call for any changes  Melanocytic Nevi - Tan-brown and/or pink-flesh-colored symmetric macules and papules - Benign appearing on exam today - Observation - Call clinic for new or changing moles - Recommend daily use of broad spectrum spf 30+ sunscreen to sun-exposed areas.   Hemangiomas - Red papules - Discussed benign nature - Observe - Call for any changes  Actinic Damage - diffuse scaly erythematous macules with underlying dyspigmentation - Recommend daily broad spectrum sunscreen SPF 30+ to sun-exposed areas, reapply every 2 hours as needed.  - Call for new or changing lesions.  Skin cancer screening performed today.   Return in about 1 month (around 05/12/2020) for acne.   I, Harriett Sine, CMA, am acting as scribe for Forest Gleason, MD.  Documentation: I have reviewed the above documentation for accuracy and completeness, and I agree with the above.  Forest Gleason, MD

## 2020-04-11 NOTE — Patient Instructions (Signed)
Recommend taking Heliocare sun protection supplement daily in sunny weather for additional sun protection. For maximum protection on the sunniest days, you can take up to 2 capsules of regular Heliocare OR take 1 capsule of Heliocare Ultra. For prolonged exposure (such as a full day in the sun), you can repeat your dose of the supplement 4 hours after your first dose. Heliocare can be purchased at [facility] or at [email or web address].       

## 2020-04-15 ENCOUNTER — Ambulatory Visit
Admission: RE | Admit: 2020-04-15 | Discharge: 2020-04-15 | Disposition: A | Discharge status: Home / Self Care | Payer: Managed Care, Other (non HMO) | Source: Ambulatory Visit | Attending: Family Medicine | Admitting: Family Medicine

## 2020-04-15 ENCOUNTER — Other Ambulatory Visit: Payer: Self-pay

## 2020-04-15 DIAGNOSIS — Z1231 Encounter for screening mammogram for malignant neoplasm of breast: Secondary | ICD-10-CM | POA: Insufficient documentation

## 2020-05-15 ENCOUNTER — Telehealth: Payer: Self-pay | Admitting: Dermatology

## 2020-05-15 ENCOUNTER — Ambulatory Visit: Payer: Managed Care, Other (non HMO) | Admitting: Dermatology

## 2020-05-15 ENCOUNTER — Other Ambulatory Visit: Payer: Self-pay

## 2020-05-15 DIAGNOSIS — L700 Acne vulgaris: Secondary | ICD-10-CM

## 2020-05-15 NOTE — Telephone Encounter (Signed)
Spoke with patient.  We were running behind in clinic today and she unfortunately had to go to another appointment and so had to leave.  She reports she is doing well on the spironolactone.  She reports she feels well and her acne is staying clear.  Her potassium was just checked and was 4.1 (viewed in [facility]).  She will check her blood pressure at work and call us to let us know what her blood pressure is.    When she calls then, she will also make a follow-up appointment for 6 months but she will let us know if she has any concerns before then related to acne, medication side effects or any new or changing lesions.  MAs, when we confirm her blood pressure, Dr. Laurence Ferrari will review and if it looks good, we will send in 6 months of refills for the spironolactone.  Thank you

## 2020-05-16 NOTE — Telephone Encounter (Signed)
Patient called and reported her blood pressure is 118/72.

## 2020-05-17 MED ORDER — SPIRONOLACTONE 100 MG PO TABS: 100.0000 mg | ORAL_TABLET | Freq: Every day | ORAL | 6 refills | Status: DC

## 2020-05-17 NOTE — Addendum Note (Signed)
Addended by: Alfonso Patten on: 05/17/2020 01:38 PM   Modules accepted: Orders

## 2020-05-17 NOTE — Telephone Encounter (Signed)
Spironolactone refills sent to pharmacy. Voicemail left for patient.  MAs please call and schedule for follow-up in 6 months. Thank you!

## 2020-05-20 NOTE — Telephone Encounter (Signed)
Left message for patient to return our call.

## 2021-03-21 ENCOUNTER — Other Ambulatory Visit: Payer: Self-pay | Admitting: Family Medicine

## 2021-03-21 DIAGNOSIS — Z1231 Encounter for screening mammogram for malignant neoplasm of breast: Secondary | ICD-10-CM

## 2021-03-26 ENCOUNTER — Other Ambulatory Visit: Payer: Self-pay

## 2021-03-26 ENCOUNTER — Ambulatory Visit: Payer: Managed Care, Other (non HMO) | Admitting: Dermatology

## 2021-03-26 ENCOUNTER — Encounter: Payer: Self-pay | Admitting: Dermatology

## 2021-03-26 DIAGNOSIS — D239 Other benign neoplasm of skin, unspecified: Secondary | ICD-10-CM

## 2021-03-26 DIAGNOSIS — L814 Other melanin hyperpigmentation: Secondary | ICD-10-CM

## 2021-03-26 DIAGNOSIS — D1800 Hemangioma unspecified site: Secondary | ICD-10-CM

## 2021-03-26 DIAGNOSIS — D2339 Other benign neoplasm of skin of other parts of face: Secondary | ICD-10-CM | POA: Diagnosis not present

## 2021-03-26 DIAGNOSIS — D225 Melanocytic nevi of trunk: Secondary | ICD-10-CM | POA: Diagnosis not present

## 2021-03-26 DIAGNOSIS — L988 Other specified disorders of the skin and subcutaneous tissue: Secondary | ICD-10-CM | POA: Diagnosis not present

## 2021-03-26 DIAGNOSIS — L821 Other seborrheic keratosis: Secondary | ICD-10-CM

## 2021-03-26 DIAGNOSIS — D229 Melanocytic nevi, unspecified: Secondary | ICD-10-CM

## 2021-03-26 DIAGNOSIS — Z1283 Encounter for screening for malignant neoplasm of skin: Secondary | ICD-10-CM

## 2021-03-26 DIAGNOSIS — L578 Other skin changes due to chronic exposure to nonionizing radiation: Secondary | ICD-10-CM

## 2021-03-26 NOTE — Progress Notes (Signed)
   Follow-Up Visit   Subjective  Jillian Hunter is a 60 y.o. female who presents for the following: Annual Exam (Moles of concern on left eyebrow and left cheek. No h/o skin cancer or DN. ).  The patient presents for Total-Body Skin Exam (TBSE) for skin cancer screening and mole check.   The following portions of the chart were reviewed this encounter and updated as appropriate:  Tobacco  Allergies  Meds  Problems  Med Hx  Surg Hx  Fam Hx      Review of Systems: No other skin or systemic complaints except as noted in HPI or Assessment and Plan.   Objective  Well appearing patient in no apparent distress; mood and affect are within normal limits.  A full examination was performed including scalp, head, eyes, ears, nose, lips, neck, chest, axillae, abdomen, back, buttocks, bilateral upper extremities, bilateral lower extremities, hands, feet, fingers, toes, fingernails, and toenails. All findings within normal limits unless otherwise noted below.  L breast 7 o'clock, right upper abdomen 0.4 cm medium to dark brown fried egg pattern with perifollicular drop-out at L breast 7 o'clock   0.7 cm tan soft papule at the right upper abdomen    Left Root of Nose, left medial cheek near chin Smooth normal flesh colored papule  upper cutaneous lip Linear furrows at perioral area.       Assessment & Plan  Nevus L breast 7 o'clock, right upper abdomen  Benign-appearing.  Observation.  Call clinic for new or changing lesions.  Recommend daily use of broad spectrum spf 30+ sunscreen to sun-exposed areas.    Intradermal nevus Left Root of Nose, left medial cheek near chin  Benign-appearing.  Observation.  Call clinic for new or changing lesions.  Recommend daily use of broad spectrum spf 30+ sunscreen to sun-exposed areas.    Rhytides upper cutaneous lip  Discussed options of: Botox, topical retinoid, laser tx., sunscreen, Alastin products.   Lentigines - Scattered tan  macules - Due to sun exposure - Benign-appearing, observe - Recommend daily broad spectrum sunscreen SPF 30+ to sun-exposed areas, reapply every 2 hours as needed. - Call for any changes  Seborrheic Keratoses - Stuck-on, waxy, tan-brown papules and/or plaques  - Benign-appearing - Discussed benign etiology and prognosis. - Observe - Call for any changes  Melanocytic Nevi - Tan-brown and/or pink-flesh-colored symmetric macules and papules - Benign appearing on exam today - Observation - Call clinic for new or changing moles - Recommend daily use of broad spectrum spf 30+ sunscreen to sun-exposed areas.   Hemangiomas - Red papules - Discussed benign nature - Observe - Call for any changes  Actinic Damage - Chronic condition, secondary to cumulative UV/sun exposure - diffuse scaly erythematous macules with underlying dyspigmentation - Recommend daily broad spectrum sunscreen SPF 30+ to sun-exposed areas, reapply every 2 hours as needed.  - Staying in the shade or wearing long sleeves, sun glasses (UVA+UVB protection) and wide brim hats (4-inch brim around the entire circumference of the hat) are also recommended for sun protection.  - Call for new or changing lesions.  Skin cancer screening performed today.   Return for TBSE in 1 year.  I, Emelia Salisbury, CMA, am acting as scribe for Forest Gleason, MD.  Documentation: I have reviewed the above documentation for accuracy and completeness, and I agree with the above.  Forest Gleason, MD

## 2021-03-26 NOTE — Patient Instructions (Addendum)
Melanoma ABCDEs  Melanoma is the most dangerous type of skin cancer, and is the leading cause of death from skin disease.  You are more likely to develop melanoma if you: Have light-colored skin, light-colored eyes, or red or blond hair Spend a lot of time in the sun Tan regularly, either outdoors or in a tanning bed Have had blistering sunburns, especially during childhood Have a close family member who has had a melanoma Have atypical moles or large birthmarks  Early detection of melanoma is key since treatment is typically straightforward and cure rates are extremely high if we catch it early.   The first sign of melanoma is often a change in a mole or a new dark spot.  The ABCDE system is a way of remembering the signs of melanoma.  A for asymmetry:  The two halves do not match. B for border:  The edges of the growth are irregular. C for color:  A mixture of colors are present instead of an even brown color. D for diameter:  Melanomas are usually (but not always) greater than 6mm - the size of a pencil eraser. E for evolution:  The spot keeps changing in size, shape, and color.  Please check your skin once per month between visits. You can use a small mirror in front and a large mirror behind you to keep an eye on the back side or your body.   If you see any new or changing lesions before your next follow-up, please call to schedule a visit.  Please continue daily skin protection including broad spectrum sunscreen SPF 30+ to sun-exposed areas, reapplying every 2 hours as needed when you're outdoors.   Staying in the shade or wearing long sleeves, sun glasses (UVA+UVB protection) and wide brim hats (4-inch brim around the entire circumference of the hat) are also recommended for sun protection.    Recommend taking Heliocare sun protection supplement daily in sunny weather for additional sun protection. For maximum protection on the sunniest days, you can take up to 2 capsules of  regular Heliocare OR take 1 capsule of Heliocare Ultra. For prolonged exposure (such as a full day in the sun), you can repeat your dose of the supplement 4 hours after your first dose. Heliocare can be purchased at [facility] or at [email or web address].    If you have any questions or concerns for your doctor, please call our main line at [phone] and press option 4 to reach your doctor's medical assistant. If no one answers, please leave a voicemail as directed and we will return your call as soon as possible. Messages left after 4 pm will be answered the following business day.   You may also send us a message via MyChart. We typically respond to MyChart messages within 1-2 business days.  For prescription refills, please ask your pharmacy to contact our office. Our fax number is [phone].  If you have an urgent issue when the clinic is closed that cannot wait until the next business day, you can page your doctor at the number below.    Please note that while we do our best to be available for urgent issues outside of office hours, we are not available 24/7.   If you have an urgent issue and are unable to reach us, you may choose to seek medical care at your doctor's office, retail clinic, urgent care center, or emergency room.  If you have a medical emergency, please immediately call 911 or go to   the emergency department.  Pager Numbers  - Dr. Kowalski: [phone]  - Dr. Moye: [phone]  - Dr. Stewart: [phone]  In the event of inclement weather, please call our main line at [phone] for an update on the status of any delays or closures.  Dermatology Medication Tips: Please keep the boxes that topical medications come in in order to help keep track of the instructions about where and how to use these. Pharmacies typically print the medication instructions only on the boxes and not directly on the medication tubes.   If your medication is too expensive,  please contact our office at [phone] option 4 or send us a message through MyChart.   We are unable to tell what your co-pay for medications will be in advance as this is different depending on your insurance coverage. However, we may be able to find a substitute medication at lower cost or fill out paperwork to get insurance to cover a needed medication.   If a prior authorization is required to get your medication covered by your insurance company, please allow us 1-2 business days to complete this process.  Drug prices often vary depending on where the prescription is filled and some pharmacies may offer cheaper prices.  The website www.goodrx.com contains coupons for medications through different pharmacies. The prices here do not account for what the cost may be with help from insurance (it may be cheaper with your insurance), but the website can give you the price if you did not use any insurance.  - You can print the associated coupon and take it with your prescription to the pharmacy.  - You may also stop by our office during regular business hours and pick up a GoodRx coupon card.  - If you need your prescription sent electronically to a different pharmacy, notify our office through [facility] MyChart or by phone at [phone] option 4.  

## 2021-04-03 ENCOUNTER — Encounter: Payer: Self-pay | Admitting: Dermatology

## 2021-04-17 ENCOUNTER — Ambulatory Visit
Admission: RE | Admit: 2021-04-17 | Discharge: 2021-04-17 | Disposition: A | Discharge status: Home / Self Care | Payer: Managed Care, Other (non HMO) | Source: Ambulatory Visit | Attending: Family Medicine | Admitting: Family Medicine

## 2021-04-17 ENCOUNTER — Other Ambulatory Visit: Payer: Self-pay

## 2021-04-17 DIAGNOSIS — Z1231 Encounter for screening mammogram for malignant neoplasm of breast: Secondary | ICD-10-CM | POA: Diagnosis present

## 2022-03-17 ENCOUNTER — Other Ambulatory Visit: Payer: Self-pay | Admitting: Family Medicine

## 2022-03-17 DIAGNOSIS — Z1231 Encounter for screening mammogram for malignant neoplasm of breast: Secondary | ICD-10-CM

## 2022-03-24 DIAGNOSIS — R7303 Prediabetes: Secondary | ICD-10-CM | POA: Insufficient documentation

## 2022-04-01 ENCOUNTER — Ambulatory Visit: Payer: Managed Care, Other (non HMO) | Admitting: Dermatology

## 2022-04-01 DIAGNOSIS — D2371 Other benign neoplasm of skin of right lower limb, including hip: Secondary | ICD-10-CM

## 2022-04-01 DIAGNOSIS — D492 Neoplasm of unspecified behavior of bone, soft tissue, and skin: Secondary | ICD-10-CM

## 2022-04-01 DIAGNOSIS — L821 Other seborrheic keratosis: Secondary | ICD-10-CM

## 2022-04-01 DIAGNOSIS — D239 Other benign neoplasm of skin, unspecified: Secondary | ICD-10-CM

## 2022-04-01 DIAGNOSIS — D229 Melanocytic nevi, unspecified: Secondary | ICD-10-CM

## 2022-04-01 DIAGNOSIS — Z1283 Encounter for screening for malignant neoplasm of skin: Secondary | ICD-10-CM | POA: Diagnosis not present

## 2022-04-01 DIAGNOSIS — L814 Other melanin hyperpigmentation: Secondary | ICD-10-CM

## 2022-04-01 DIAGNOSIS — L578 Other skin changes due to chronic exposure to nonionizing radiation: Secondary | ICD-10-CM | POA: Diagnosis not present

## 2022-04-01 DIAGNOSIS — D225 Melanocytic nevi of trunk: Secondary | ICD-10-CM

## 2022-04-01 HISTORY — DX: Other benign neoplasm of skin, unspecified: D23.9

## 2022-04-01 NOTE — Patient Instructions (Addendum)
Recommend taking Heliocare sun protection supplement daily in sunny weather for additional sun protection. For maximum protection on the sunniest days, you can take up to 2 capsules of regular Heliocare OR take 1 capsule of Heliocare Ultra. For prolonged exposure (such as a full day in the sun), you can repeat your dose of the supplement 4 hours after your first dose. Heliocare can be purchased at [facility], at some [facility] or at [email or web address].    Wound Care Instructions  Cleanse wound gently with soap and water once a day then pat dry with clean gauze. Apply a thin coat of Petrolatum (petroleum jelly, "Vaseline") over the wound (unless you have an allergy to this). We recommend that you use a new, sterile tube of Vaseline. Do not pick or remove scabs. Do not remove the yellow or white "healing tissue" from the base of the wound.  Cover the wound with fresh, clean, nonstick gauze and secure with paper tape. You may use Band-Aids in place of gauze and tape if the wound is small enough, but would recommend trimming much of the tape off as there is often too much. Sometimes Band-Aids can irritate the skin.  You should call the office for your biopsy report after 1 week if you have not already been contacted.  If you experience any problems, such as abnormal amounts of bleeding, swelling, significant bruising, significant pain, or evidence of infection, please call the office immediately.  FOR ADULT SURGERY PATIENTS: If you need something for pain relief you may take 1 extra strength Tylenol (acetaminophen) AND 2 Ibuprofen (200mg each) together every 4 hours as needed for pain. (do not take these if you are allergic to them or if you have a reason you should not take them.) Typically, you may only need pain medication for 1 to 3 days.    Melanoma ABCDEs  Melanoma is the most dangerous type of skin cancer, and is the leading cause of death from skin disease.  You are more likely to  develop melanoma if you: Have light-colored skin, light-colored eyes, or red or blond hair Spend a lot of time in the sun Tan regularly, either outdoors or in a tanning bed Have had blistering sunburns, especially during childhood Have a close family member who has had a melanoma Have atypical moles or large birthmarks  Early detection of melanoma is key since treatment is typically straightforward and cure rates are extremely high if we catch it early.   The first sign of melanoma is often a change in a mole or a new dark spot.  The ABCDE system is a way of remembering the signs of melanoma.  A for asymmetry:  The two halves do not match. B for border:  The edges of the growth are irregular. C for color:  A mixture of colors are present instead of an even brown color. D for diameter:  Melanomas are usually (but not always) greater than 6mm - the size of a pencil eraser. E for evolution:  The spot keeps changing in size, shape, and color.  Please check your skin once per month between visits. You can use a small mirror in front and a large mirror behind you to keep an eye on the back side or your body.   If you see any new or changing lesions before your next follow-up, please call to schedule a visit.  Please continue daily skin protection including broad spectrum sunscreen SPF 30+ to sun-exposed areas, reapplying every 2 hours   hours as needed when you're outdoors.    Due to recent changes in healthcare laws, you may see results of your pathology and/or laboratory studies on MyChart before the doctors have had a chance to review them. We understand that in some cases there may be results that are confusing or concerning to you. Please understand that not all results are received at the same time and often the doctors may need to interpret multiple results in order to provide you with the best plan of care or course of treatment. Therefore, we ask that you please give [place] 2 business days to  thoroughly review all your results before contacting the office for clarification. Should we see a critical lab result, you will be contacted sooner.   If You Need Anything After Your Visit  If you have any questions or concerns for your doctor, please call our main line at [phone] and press option 4 to reach your doctor's medical assistant. If no one answers, please leave a voicemail as directed and we will return your call as soon as possible. Messages left after 4 pm will be answered the following business day.   You may also send [place] a message via [place]. We typically respond to MyChart messages within 1-2 business days.  For prescription refills, please ask your pharmacy to contact our office. Our fax number is [phone].  If you have an urgent issue when the clinic is closed that cannot wait until the next business day, you can page your doctor at the number below.    Please note that while we do our best to be available for urgent issues outside of office hours, we are not available 24/7.   If you have an urgent issue and are unable to reach [place], you may choose to seek medical care at your doctor's office, retail clinic, urgent care center, or emergency room.  If you have a medical emergency, please immediately call 911 or go to the emergency department.  Pager Numbers  - Dr. Nehemiah Massed: [phone]  - Dr. Laurence Ferrari: [phone]  - Dr. Nicole Kindred: [phone]  In the event of inclement weather, please call our main line at [phone] for an update on the status of any delays or closures.  Dermatology Medication Tips: Please keep the boxes that topical medications come in in order to help keep track of the instructions about where and how to use these. Pharmacies typically print the medication instructions only on the boxes and not directly on the medication tubes.   If your medication is too expensive, please contact our office at [phone] option 4 or send [place] a message  through [place].   We are unable to tell what your co-pay for medications will be in advance as this is different depending on your insurance coverage. However, we may be able to find a substitute medication at lower cost or fill out paperwork to get insurance to cover a needed medication.   If a prior authorization is required to get your medication covered by your insurance company, please allow [place] 1-2 business days to complete this process.  Drug prices often vary depending on where the prescription is filled and some pharmacies may offer cheaper prices.  The website www.goodrx.com contains coupons for medications through different pharmacies. The prices here do not account for what the cost may be with help from insurance (it may be cheaper with your insurance), but the website can give you the price if you did not use any insurance.  -  print the associated coupon and take it with your prescription to the pharmacy.  - You may also stop by our office during regular business hours and pick up a GoodRx coupon card.  - If you need your prescription sent electronically to a different pharmacy, notify our office through [place] MyChart or by phone at [phone] option 4.     Si Usted Necesita Algo Despus de Su Visita  Tambin puede enviarnos un mensaje a travs de MyChart. Por lo general respondemos a los mensajes de MyChart en el transcurso de 1 a 2 das hbiles.  Para renovar recetas, por favor pida a su farmacia que se ponga en contacto con nuestra oficina. Nuestro nmero de fax es el [phone].  Si tiene un asunto urgente cuando la clnica est cerrada y que no puede esperar hasta el siguiente da hbil, puede llamar/localizar a su doctor(a) al nmero que aparece a continuacin.   Por favor, tenga en cuenta que aunque hacemos todo lo posible para estar disponibles para asuntos urgentes fuera del horario de oficina, no estamos disponibles las 24 horas del da, los 7 das de la  semana.   Si tiene un problema urgente y no puede comunicarse con nosotros, puede optar por buscar atencin mdica  en el consultorio de su doctor(a), en una clnica privada, en un centro de atencin urgente o en una sala de emergencias.  Si tiene una emergencia mdica, por favor llame inmediatamente al 911 o vaya a la sala de emergencias.  Nmeros de bper  - Dr. Kowalski: [phone]  - Dra. Moye: [phone]  - Dra. Stewart: [phone]  En caso de inclemencias del tiempo, por favor llame a nuestra lnea principal al [phone] para una actualizacin sobre el estado de cualquier retraso o cierre.  Consejos para la medicacin en dermatologa: Por favor, guarde las cajas en las que vienen los medicamentos de uso tpico para ayudarle a seguir las instrucciones sobre dnde y cmo usarlos. Las farmacias generalmente imprimen las instrucciones del medicamento slo en las cajas y no directamente en los tubos del medicamento.   Si su medicamento es muy caro, por favor, pngase en contacto con nuestra oficina llamando al [phone] y presione la opcin 4 o envenos un mensaje a travs de MyChart.   No podemos decirle cul ser su copago por los medicamentos por adelantado ya que esto es diferente dependiendo de la cobertura de su seguro. Sin embargo, es posible que podamos encontrar un medicamento sustituto a menor costo o llenar un formulario para que el seguro cubra el medicamento que se considera necesario.   Si se requiere una autorizacin previa para que su compaa de seguros cubra su medicamento, por favor permtanos de 1 a 2 das hbiles para completar este proceso.  Los precios de los medicamentos varan con frecuencia dependiendo del lugar de dnde se surte la receta y alguna farmacias pueden ofrecer precios ms baratos.  El sitio web www.goodrx.com tiene cupones para medicamentos de diferentes farmacias. Los precios aqu no tienen en cuenta lo que podra costar con la ayuda del  seguro (puede ser ms barato con su seguro), pero el sitio web puede darle el precio si no utiliz ningn seguro.  - Puede imprimir el cupn correspondiente y llevarlo con su receta a la farmacia.  - Tambin puede pasar por nuestra oficina durante el horario de atencin regular y recoger una tarjeta de cupones de GoodRx.  - Si necesita que su receta se enve electrnicamente a una farmacia diferente, informe a nuestra   a nuestra oficina a travs de MyChart de [place] o por telfono llamando al [phone] y presione la opcin 4.

## 2022-04-01 NOTE — Progress Notes (Signed)
Follow-Up Visit   Subjective  Jillian Hunter is a 61 y.o. female who presents for the following: Annual Exam (The patient presents for Total-Body Skin Exam (TBSE) for skin cancer screening and mole check.  The patient has spots, moles and lesions to be evaluated, some may be new or changing and the patient has concerns that these could be cancer./).  Family history of skin cancer - what type(s): melanoma - who affected: father   The following portions of the chart were reviewed this encounter and updated as appropriate:   Tobacco  Allergies  Meds  Problems  Med Hx  Surg Hx  Fam Hx      Review of Systems:  No other skin or systemic complaints except as noted in HPI or Assessment and Plan.  Objective  Well appearing patient in no apparent distress; mood and affect are within normal limits.  A full examination was performed including scalp, head, eyes, ears, nose, lips, neck, chest, axillae, abdomen, back, buttocks, bilateral upper extremities, bilateral lower extremities, hands, feet, fingers, toes, fingernails, and toenails. All findings within normal limits unless otherwise noted below.  left breast 7 o'clock 0.45 cm medium to dark brown thin papule darker superior border to center R/o Atypia      right inferior breast 5 o'clock 0.8 cm pink papule R/o Atypia     Right Upper Abdomen 0.7 cm tan soft papule at the right upper abdomen       Assessment & Plan  Neoplasm of skin (2) left breast 7 o'clock  Epidermal / dermal shaving  Lesion diameter (cm):  0.5 Informed consent: discussed and consent obtained   Timeout: patient name, date of birth, surgical site, and procedure verified   Anesthesia: the lesion was anesthetized in a standard fashion   Anesthetic:  1% lidocaine w/ epinephrine 1-100,000 local infiltration Instrument used: flexible razor blade   Hemostasis achieved with: aluminum chloride   Outcome: patient tolerated procedure well    Post-procedure details: wound care instructions given   Additional details:  Mupirocin and a bandage applied  Specimen 1 - Surgical pathology Differential Diagnosis: R/o Atypia  Check Margins: No 0.45 cm medium to dark brown thin papule darker superior border to center   right inferior breast 5 o'clock  Epidermal / dermal shaving  Lesion diameter (cm):  0.8 Informed consent: discussed and consent obtained   Timeout: patient name, date of birth, surgical site, and procedure verified   Anesthesia: the lesion was anesthetized in a standard fashion   Anesthetic:  1% lidocaine w/ epinephrine 1-100,000 local infiltration Instrument used: flexible razor blade   Hemostasis achieved with: aluminum chloride   Outcome: patient tolerated procedure well   Post-procedure details: wound care instructions given   Additional details:  Mupirocin and a bandage applied  Specimen 2 - Surgical pathology Differential Diagnosis: R/o Atypia  Check Margins: No 0.8 cm pink papule   Nevus Right Upper Abdomen  Benign-appearing.  Observation.  Call clinic for new or changing lesions.    Lentigines - Scattered tan macules - Due to sun exposure - Benign-appearing, observe - Recommend daily broad spectrum sunscreen SPF 30+ to sun-exposed areas, reapply every 2 hours as needed. - Call for any changes  Seborrheic Keratoses - Stuck-on, waxy, tan-brown papules and/or plaques  - Benign-appearing - Discussed benign etiology and prognosis. - Observe - Call for any changes  Melanocytic Nevi - Tan-brown and/or pink-flesh-colored symmetric macules and papules - Benign appearing on exam today - Observation - Call clinic  for new or changing moles - Recommend daily use of broad spectrum spf 30+ sunscreen to sun-exposed areas.   Hemangiomas - Red papules - Discussed benign nature - Observe - Call for any changes  Actinic Damage - Chronic condition, secondary to cumulative UV/sun exposure -  diffuse scaly erythematous macules with underlying dyspigmentation - Recommend daily broad spectrum sunscreen SPF 30+ to sun-exposed areas, reapply every 2 hours as needed.  - Staying in the shade or wearing long sleeves, sun glasses (UVA+UVB protection) and wide brim hats (4-inch brim around the entire circumference of the hat) are also recommended for sun protection.  - Call for new or changing lesions.  Skin cancer screening performed today.  Dermatofibroma - Firm pink/brown papulenodule with dimple sign at right calf - Benign appearing - Call for any changes  Return in about 1 year (around 04/02/2023) for TBSE.  Graciella Belton, RMA, am acting as scribe for Forest Gleason, MD .  Documentation: I have reviewed the above documentation for accuracy and completeness, and I agree with the above.  Forest Gleason, MD

## 2022-04-09 ENCOUNTER — Telehealth: Payer: Self-pay

## 2022-04-09 ENCOUNTER — Encounter: Payer: Self-pay | Admitting: Dermatology

## 2022-04-09 NOTE — Telephone Encounter (Signed)
Patient advised of BX results. Surgery Scheduled. aw

## 2022-04-09 NOTE — Telephone Encounter (Addendum)
  Tried calling patient regarding bx results. No answer. LMOM for patient to return call.    ----- Message from Alfonso Patten, MD sent at 04/07/2022 11:06 PM EDT ----- 1. 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 On the spectrum from normal mole to melanoma skin cancer, this is in between the two but closer towards a melanoma skin cancer.  - The treatment of choice for severely atypical moles is to cut them out in clinic with an area of normal looking skin around them to get all the atypical cells out. The skin that is removed will be sent to check under the microscope again to be sure it looks completely out.   - People who have a history of atypical moles do have a slightly increased risk of developing melanoma somewhere on the body, so a full body skin exam by a dermatologist is recommended at least once a year. - Monthly self skin checks and daily sun protection are also recommended.  - Please also call if you notice any new or changing spots anywhere else on the body before your follow-up visit.   2. 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 On the spectrum from normal mole to melanoma skin cancer, this is in between but it is much closer to a normal mole.  - These typically do not progress to melanoma or cause any trouble.  - People who have a history of atypical moles do have a slightly increased risk of developing melanoma somewhere on the body, so a yearly full body skin exam by a dermatologist is recommended.  - Monthly self skin checks and daily sun protection are also recommended.  - Please call if you notice a dark spot coming back where this biopsy was taken.  - Please also call if you notice any new or  changing spots anywhere else on the body before your follow-up visit.  - Please call our office or send [place] a message if you have any questions or concerns about this biopsy result.    MAs please call with results and schedule. Let me know if she has questions for me. Thank you!

## 2022-04-29 ENCOUNTER — Ambulatory Visit
Admission: RE | Admit: 2022-04-29 | Discharge: 2022-04-29 | Disposition: A | Discharge status: Home / Self Care | Payer: Managed Care, Other (non HMO) | Source: Ambulatory Visit | Attending: Family Medicine | Admitting: Family Medicine

## 2022-04-29 DIAGNOSIS — Z1231 Encounter for screening mammogram for malignant neoplasm of breast: Secondary | ICD-10-CM | POA: Diagnosis present

## 2022-05-13 ENCOUNTER — Ambulatory Visit: Payer: Managed Care, Other (non HMO) | Admitting: Dermatology

## 2022-05-13 DIAGNOSIS — D485 Neoplasm of uncertain behavior of skin: Secondary | ICD-10-CM

## 2022-05-13 DIAGNOSIS — D492 Neoplasm of unspecified behavior of bone, soft tissue, and skin: Secondary | ICD-10-CM

## 2022-05-13 NOTE — Progress Notes (Signed)
   Follow-Up Visit   Subjective  Jillian Hunter is a 61 y.o. female who presents for the following: Procedure (Patient here today for excision of DYSPLASTIC NEVUS WITH MODERATE TO SEVERE ATYPIA at left breast 7 o'clock.).   The following portions of the chart were reviewed this encounter and updated as appropriate:   Tobacco  Allergies  Meds  Problems  Med Hx  Surg Hx  Fam Hx      Review of Systems:  No other skin or systemic complaints except as noted in HPI or Assessment and Plan.  Objective  Well appearing patient in no apparent distress; mood and affect are within normal limits.  A focused examination was performed including breast. Relevant physical exam findings are noted in the Assessment and Plan.  left breast 7 o'clock Pink biopsy site    Assessment & Plan  Neoplasm of skin left breast 7 o'clock  Skin excision  Lesion length (cm):  1 Margin per side (cm):  0.5 Total excision diameter (cm):  2 Informed consent: discussed and consent obtained   Timeout: patient name, date of birth, surgical site, and procedure verified   Procedure prep:  Patient was prepped and draped in usual sterile fashion Prep type:  Chlorhexidine Anesthesia: the lesion was anesthetized in a standard fashion   Anesthetic:  1% lidocaine w/ epinephrine 1-100,000 buffered w/ 8.4% NaHCO3 (6cc lido w/epi, 6cc bupivicaine) Instrument used comment:  15c Hemostasis achieved with: pressure and electrodesiccation    Skin repair Complexity:  Intermediate Final length (cm):  5.6 Informed consent: discussed and consent obtained   Timeout: patient name, date of birth, surgical site, and procedure verified   Procedure prep:  Patient was prepped and draped in usual sterile fashion Prep type:  Chlorhexidine Anesthesia: the lesion was anesthetized in a standard fashion   Anesthetic:  1% lidocaine w/ epinephrine 1-100,000 local infiltration Reason for type of repair: reduce tension to allow closure,  reduce the risk of dehiscence, infection, and necrosis, preserve normal anatomy and preserve normal anatomical and functional relationships   Undermining: edges undermined   Subcutaneous layers (deep stitches):  Suture size:  3-0 and 4-0 Suture type: Vicryl (polyglactin 910)   Fine/surface layer approximation (top stitches):  Suture size:  4-0 Suture type: Prolene (polypropylene)   Suture removal (days):  7 Hemostasis achieved with: suture, pressure and electrodesiccation Outcome: patient tolerated procedure well with no complications   Post-procedure details: wound care instructions given   Additional details:  Mupirocin and a pressure dressing applied  Specimen 1 - Surgical pathology Differential Diagnosis: BX proven dysplastic nevus with moderate to severe atypia  Check Margins: yes Pink biopsy site [phone]     Return in about 1 week (around 05/20/2022) for Suture Removal.  Graciella Belton, RMA, am acting as scribe for Forest Gleason, MD .  Documentation: I have reviewed the above documentation for accuracy and completeness, and I agree with the above.  Forest Gleason, MD

## 2022-05-13 NOTE — Patient Instructions (Signed)
Wound Care Instructions for After Surgery  On the day following your surgery, you should begin doing daily dressing changes until your sutures are removed: Remove the bandage. Cleanse the wound gently with soap and water.  Make sure you then dry the skin surrounding the wound completely or the tape will not stick to the skin. Do not use cotton balls on the wound. After the wound is clean and dry, apply the ointment (either prescription antibiotic prescribed by your doctor or plain Vaseline if nothing was prescribed) gently with a Q-tip. If you are using a bandaid to cover: Apply a bandaid large enough to cover the entire wound. If you do not have a bandaid large enough to cover the wound OR if you are sensitive to bandaid adhesive: Cut a non-stick pad (such as Telfa) to fit the size of the wound.  Cover the wound with the non-stick pad. If the wound is draining, you may want to add a small amount of gauze on top of the non-stick pad for a little added compression to the area. Use tape to seal the area completely.  For the next 1-2 weeks: Be sure to keep the wound moist with ointment 24/7 to ensure best healing. If you are unable to cover the wound with a bandage to hold the ointment in place, you may need to reapply the ointment several times a day. Do not bend over or lift heavy items to reduce the chance of elevated blood pressure to the wound. Do not participate in particularly strenuous activities.  Below is a list of dressing supplies you might need.  Cotton-tipped applicators - Q-tips Gauze pads (2x2 and/or 4x4) - All-Purpose Sponges New and clean tube of petroleum jelly (Vaseline) OR prescription antibiotic ointment if prescribed Either a bandaid large enough to cover the entire wound OR non-stick dressing material (Telfa) and Tape (Paper or Hypafix)  FOR ADULT SURGERY PATIENTS: If you need something for pain relief, you may take 1 extra strength Tylenol (acetaminophen) and 2  ibuprofen (200 mg) together every 4 hours as needed. (Do not take these medications if you are allergic to them or if you know you cannot take them for any other reason). Typically you may only need pain medication for 1-3 days.   Comments on the Post-Operative Period Slight swelling and redness often appear around the wound. This is normal and will disappear within several days following the surgery. The healing wound will drain a brownish-red-yellow discharge during healing. This is a normal phase of wound healing. As the wound begins to heal, the drainage may increase in amount. Again, this drainage is normal. Notify us if the drainage becomes persistently bloody, excessively swollen, or intensely painful or develops a foul odor or red streaks.  The healing wound will also typically be itchy. This is normal. If you have severe or persistent pain, Notify us if the discomfort is severe or persistent. Avoid alcoholic beverages when taking pain medicine.  In Case of Wound Hemorrhage A wound hemorrhage is when the bandage suddenly becomes soaked with bright red blood and flows profusely. If this happens, sit down or lie down with your head elevated. If the wound has a dressing on it, do not remove the dressing. Apply pressure to the existing gauze. If the wound is not covered, use a gauze pad to apply pressure and continue applying the pressure for 20 minutes without peeking. DO NOT COVER THE WOUND WITH A LARGE TOWEL OR WASH CLOTH. Release your hand from the   wound site but do not remove the dressing. If the bleeding has stopped, gently clean around the wound. Leave the dressing in place for 24 hours if possible. This wait time allows the blood vessels to close off so that you do not spark a new round of bleeding by disrupting the newly clotted blood vessels with an immediate dressing change. If the bleeding does not subside, continue to hold pressure for 40 minutes. If bleeding continues, page your  physician, contact an After Hours clinic or go to the Emergency Room.  Due to recent changes in healthcare laws, you may see results of your pathology and/or laboratory studies on MyChart before the doctors have had a chance to review them. We understand that in some cases there may be results that are confusing or concerning to you. Please understand that not all results are received at the same time and often the doctors may need to interpret multiple results in order to provide you with the best plan of care or course of treatment. Therefore, we ask that you please give us 2 business days to thoroughly review all your results before contacting the office for clarification. Should we see a critical lab result, you will be contacted sooner.   If You Need Anything After Your Visit  If you have any questions or concerns for your doctor, please call our main line at [phone] and press option 4 to reach your doctor's medical assistant. If no one answers, please leave a voicemail as directed and we will return your call as soon as possible. Messages left after 4 pm will be answered the following business day.   You may also send us a message via MyChart. We typically respond to MyChart messages within 1-2 business days.  For prescription refills, please ask your pharmacy to contact our office. Our fax number is [phone].  If you have an urgent issue when the clinic is closed that cannot wait until the next business day, you can page your doctor at the number below.    Please note that while we do our best to be available for urgent issues outside of office hours, we are not available 24/7.   If you have an urgent issue and are unable to reach us, you may choose to seek medical care at your doctor's office, retail clinic, urgent care center, or emergency room.  If you have a medical emergency, please immediately call 911 or go to the emergency department.  Pager Numbers  - Dr. Kowalski:  [phone]  - Dr. Moye: [phone]  - Dr. Stewart: [phone]  In the event of inclement weather, please call our main line at [phone] for an update on the status of any delays or closures.  Dermatology Medication Tips: Please keep the boxes that topical medications come in in order to help keep track of the instructions about where and how to use these. Pharmacies typically print the medication instructions only on the boxes and not directly on the medication tubes.   If your medication is too expensive, please contact our office at [phone] option 4 or send us a message through MyChart.   We are unable to tell what your co-pay for medications will be in advance as this is different depending on your insurance coverage. However, we may be able to find a substitute medication at lower cost or fill out paperwork to get insurance to cover a needed medication.   If a prior authorization is required to get your medication covered by   your insurance company, please allow us 1-2 business days to complete this process.  Drug prices often vary depending on where the prescription is filled and some pharmacies may offer cheaper prices.  The website www.goodrx.com contains coupons for medications through different pharmacies. The prices here do not account for what the cost may be with help from insurance (it may be cheaper with your insurance), but the website can give you the price if you did not use any insurance.  - You can print the associated coupon and take it with your prescription to the pharmacy.  - You may also stop by our office during regular business hours and pick up a GoodRx coupon card.  - If you need your prescription sent electronically to a different pharmacy, notify our office through [facility] MyChart or by phone at [phone] option 4.     Si Usted Necesita Algo Despus de Su Visita  Tambin puede enviarnos un mensaje a travs de MyChart. Por lo general  respondemos a los mensajes de MyChart en el transcurso de 1 a 2 das hbiles.  Para renovar recetas, por favor pida a su farmacia que se ponga en contacto con nuestra oficina. Nuestro nmero de fax es el [phone].  Si tiene un asunto urgente cuando la clnica est cerrada y que no puede esperar hasta el siguiente da hbil, puede llamar/localizar a su doctor(a) al nmero que aparece a continuacin.   Por favor, tenga en cuenta que aunque hacemos todo lo posible para estar disponibles para asuntos urgentes fuera del horario de oficina, no estamos disponibles las 24 horas del da, los 7 das de la semana.   Si tiene un problema urgente y no puede comunicarse con nosotros, puede optar por buscar atencin mdica  en el consultorio de su doctor(a), en una clnica privada, en un centro de atencin urgente o en una sala de emergencias.  Si tiene una emergencia mdica, por favor llame inmediatamente al 911 o vaya a la sala de emergencias.  Nmeros de bper  - Dr. Kowalski: [phone]  - Dra. Moye: [phone]  - Dra. Stewart: [phone]  En caso de inclemencias del tiempo, por favor llame a nuestra lnea principal al [phone] para una actualizacin sobre el estado de cualquier retraso o cierre.  Consejos para la medicacin en dermatologa: Por favor, guarde las cajas en las que vienen los medicamentos de uso tpico para ayudarle a seguir las instrucciones sobre dnde y cmo usarlos. Las farmacias generalmente imprimen las instrucciones del medicamento slo en las cajas y no directamente en los tubos del medicamento.   Si su medicamento es muy caro, por favor, pngase en contacto con nuestra oficina llamando al [phone] y presione la opcin 4 o envenos un mensaje a travs de MyChart.   No podemos decirle cul ser su copago por los medicamentos por adelantado ya que esto es diferente dependiendo de la cobertura de su seguro. Sin embargo, es posible que podamos encontrar un  medicamento sustituto a menor costo o llenar un formulario para que el seguro cubra el medicamento que se considera necesario.   Si se requiere una autorizacin previa para que su compaa de seguros cubra su medicamento, por favor permtanos de 1 a 2 das hbiles para completar este proceso.  Los precios de los medicamentos varan con frecuencia dependiendo del lugar de dnde se surte la receta y alguna farmacias pueden ofrecer precios ms baratos.  El sitio web www.goodrx.com tiene cupones para medicamentos de diferentes farmacias. Los precios aqu no tienen   en cuenta lo que podra costar con la ayuda del seguro (puede ser ms barato con su seguro), pero el sitio web puede darle el precio si no utiliz ningn seguro.  - Puede imprimir el cupn correspondiente y llevarlo con su receta a la farmacia.  - Tambin puede pasar por nuestra oficina durante el horario de atencin regular y recoger una tarjeta de cupones de GoodRx.  - Si necesita que su receta se enve electrnicamente a una farmacia diferente, informe a nuestra oficina a travs de MyChart de [facility] o por telfono llamando al [phone] y presione la opcin 4.  

## 2022-05-14 ENCOUNTER — Telehealth: Payer: Self-pay

## 2022-05-14 NOTE — Telephone Encounter (Signed)
Patient doing fine after yesterday's surgery. Jillian Hunter., RMA

## 2022-05-20 ENCOUNTER — Ambulatory Visit: Payer: Managed Care, Other (non HMO) | Admitting: Dermatology

## 2022-05-21 ENCOUNTER — Ambulatory Visit (INDEPENDENT_AMBULATORY_CARE_PROVIDER_SITE_OTHER): Payer: Managed Care, Other (non HMO) | Admitting: Dermatology

## 2022-05-21 DIAGNOSIS — Z86018 Personal history of other benign neoplasm: Secondary | ICD-10-CM

## 2022-05-21 DIAGNOSIS — Z4802 Encounter for removal of sutures: Secondary | ICD-10-CM

## 2022-05-21 NOTE — Patient Instructions (Signed)
Due to recent changes in healthcare laws, you may see results of your pathology and/or laboratory studies on MyChart before the doctors have had a chance to review them. We understand that in some cases there may be results that are confusing or concerning to you. Please understand that not all results are received at the same time and often the doctors may need to interpret multiple results in order to provide you with the best plan of care or course of treatment. Therefore, we ask that you please give us 2 business days to thoroughly review all your results before contacting the office for clarification. Should we see a critical lab result, you will be contacted sooner.   If You Need Anything After Your Visit  If you have any questions or concerns for your doctor, please call our main line at [phone] and press option 4 to reach your doctor's medical assistant. If no one answers, please leave a voicemail as directed and we will return your call as soon as possible. Messages left after 4 pm will be answered the following business day.   You may also send us a message via MyChart. We typically respond to MyChart messages within 1-2 business days.  For prescription refills, please ask your pharmacy to contact our office. Our fax number is [phone].  If you have an urgent issue when the clinic is closed that cannot wait until the next business day, you can page your doctor at the number below.    Please note that while we do our best to be available for urgent issues outside of office hours, we are not available 24/7.   If you have an urgent issue and are unable to reach us, you may choose to seek medical care at your doctor's office, retail clinic, urgent care center, or emergency room.  If you have a medical emergency, please immediately call 911 or go to the emergency department.  Pager Numbers  - Dr. Kowalski: [phone]  - Dr. Moye: [phone]  - Dr. Stewart:  [phone]  In the event of inclement weather, please call our main line at [phone] for an update on the status of any delays or closures.  Dermatology Medication Tips: Please keep the boxes that topical medications come in in order to help keep track of the instructions about where and how to use these. Pharmacies typically print the medication instructions only on the boxes and not directly on the medication tubes.   If your medication is too expensive, please contact our office at [phone] option 4 or send us a message through MyChart.   We are unable to tell what your co-pay for medications will be in advance as this is different depending on your insurance coverage. However, we may be able to find a substitute medication at lower cost or fill out paperwork to get insurance to cover a needed medication.   If a prior authorization is required to get your medication covered by your insurance company, please allow us 1-2 business days to complete this process.  Drug prices often vary depending on where the prescription is filled and some pharmacies may offer cheaper prices.  The website www.goodrx.com contains coupons for medications through different pharmacies. The prices here do not account for what the cost may be with help from insurance (it may be cheaper with your insurance), but the website can give you the price if you did not use any insurance.  - You can print the associated coupon and take it with   your prescription to the pharmacy.  - You may also stop by our office during regular business hours and pick up a GoodRx coupon card.  - If you need your prescription sent electronically to a different pharmacy, notify our office through [facility] MyChart or by phone at [phone] option 4.     Si Usted Necesita Algo Despus de Su Visita  Tambin puede enviarnos un mensaje a travs de MyChart. Por lo general respondemos a los mensajes de MyChart en el transcurso de 1 a 2  das hbiles.  Para renovar recetas, por favor pida a su farmacia que se ponga en contacto con nuestra oficina. Nuestro nmero de fax es el [phone].  Si tiene un asunto urgente cuando la clnica est cerrada y que no puede esperar hasta el siguiente da hbil, puede llamar/localizar a su doctor(a) al nmero que aparece a continuacin.   Por favor, tenga en cuenta que aunque hacemos todo lo posible para estar disponibles para asuntos urgentes fuera del horario de oficina, no estamos disponibles las 24 horas del da, los 7 das de la semana.   Si tiene un problema urgente y no puede comunicarse con nosotros, puede optar por buscar atencin mdica  en el consultorio de su doctor(a), en una clnica privada, en un centro de atencin urgente o en una sala de emergencias.  Si tiene una emergencia mdica, por favor llame inmediatamente al 911 o vaya a la sala de emergencias.  Nmeros de bper  - Dr. Kowalski: [phone]  - Dra. Moye: [phone]  - Dra. Stewart: [phone]  En caso de inclemencias del tiempo, por favor llame a nuestra lnea principal al [phone] para una actualizacin sobre el estado de cualquier retraso o cierre.  Consejos para la medicacin en dermatologa: Por favor, guarde las cajas en las que vienen los medicamentos de uso tpico para ayudarle a seguir las instrucciones sobre dnde y cmo usarlos. Las farmacias generalmente imprimen las instrucciones del medicamento slo en las cajas y no directamente en los tubos del medicamento.   Si su medicamento es muy caro, por favor, pngase en contacto con nuestra oficina llamando al [phone] y presione la opcin 4 o envenos un mensaje a travs de MyChart.   No podemos decirle cul ser su copago por los medicamentos por adelantado ya que esto es diferente dependiendo de la cobertura de su seguro. Sin embargo, es posible que podamos encontrar un medicamento sustituto a menor costo o llenar un formulario para que el  seguro cubra el medicamento que se considera necesario.   Si se requiere una autorizacin previa para que su compaa de seguros cubra su medicamento, por favor permtanos de 1 a 2 das hbiles para completar este proceso.  Los precios de los medicamentos varan con frecuencia dependiendo del lugar de dnde se surte la receta y alguna farmacias pueden ofrecer precios ms baratos.  El sitio web www.goodrx.com tiene cupones para medicamentos de diferentes farmacias. Los precios aqu no tienen en cuenta lo que podra costar con la ayuda del seguro (puede ser ms barato con su seguro), pero el sitio web puede darle el precio si no utiliz ningn seguro.  - Puede imprimir el cupn correspondiente y llevarlo con su receta a la farmacia.  - Tambin puede pasar por nuestra oficina durante el horario de atencin regular y recoger una tarjeta de cupones de GoodRx.  - Si necesita que su receta se enve electrnicamente a una farmacia diferente, informe a nuestra oficina a travs de MyChart de [facility]   o por telfono llamando al [phone] y presione la opcin 4.  

## 2022-05-21 NOTE — Progress Notes (Signed)
   Follow-Up Visit   Subjective  Fredi Hurtado is a 61 y.o. female who presents for the following: Suture removal/post op (L breast 7 o'clock - margins free severely dysplastic nevus, patient is here today for suture removal).  The following portions of the chart were reviewed this encounter and updated as appropriate:   Tobacco  Allergies  Meds  Problems  Med Hx  Surg Hx  Fam Hx      Review of Systems:  No other skin or systemic complaints except as noted in HPI or Assessment and Plan.  Objective  Well appearing patient in no apparent distress; mood and affect are within normal limits.  A focused examination was performed including the face and trunk. Relevant physical exam findings are noted in the Assessment and Plan.  L breast at 7 o'clock Healing excision site.    Assessment & Plan  History of dysplastic nevus L breast at 7 o'clock  Encounter for Removal of Sutures - Incision site at the L breast 7 o'clock is clean, dry and intact - Wound cleansed, sutures removed, wound cleansed and steri strips applied.  - Discussed pathology results showing a margins free severely dysplastic nevus  - Patient advised to keep steri-strips dry until they fall off. - Scars remodel for a full year. - Once steri-strips fall off, patient can apply over-the-counter silicone scar cream each night to help with scar remodeling if desired. - Patient advised to call with any concerns or if they notice any new or changing lesions.    Return in about 6 months (around 11/20/2022) for TBSE.  Luther Redo, CMA, am acting as scribe for Forest Gleason, MD .  Documentation: I have reviewed the above documentation for accuracy and completeness, and I agree with the above.  Forest Gleason, MD

## 2022-06-03 ENCOUNTER — Encounter: Payer: Self-pay | Admitting: Dermatology

## 2022-11-26 ENCOUNTER — Encounter: Payer: Managed Care, Other (non HMO) | Admitting: Dermatology

## 2023-01-26 ENCOUNTER — Encounter: Payer: Self-pay | Admitting: Dermatology

## 2023-01-26 ENCOUNTER — Telehealth: Payer: Self-pay | Admitting: Dermatology

## 2023-01-26 ENCOUNTER — Ambulatory Visit: Payer: Managed Care, Other (non HMO) | Admitting: Dermatology

## 2023-01-26 DIAGNOSIS — L578 Other skin changes due to chronic exposure to nonionizing radiation: Secondary | ICD-10-CM | POA: Diagnosis not present

## 2023-01-26 DIAGNOSIS — D229 Melanocytic nevi, unspecified: Secondary | ICD-10-CM

## 2023-01-26 DIAGNOSIS — L570 Actinic keratosis: Secondary | ICD-10-CM

## 2023-01-26 DIAGNOSIS — L814 Other melanin hyperpigmentation: Secondary | ICD-10-CM | POA: Diagnosis not present

## 2023-01-26 DIAGNOSIS — D1801 Hemangioma of skin and subcutaneous tissue: Secondary | ICD-10-CM

## 2023-01-26 DIAGNOSIS — D239 Other benign neoplasm of skin, unspecified: Secondary | ICD-10-CM

## 2023-01-26 DIAGNOSIS — Z1283 Encounter for screening for malignant neoplasm of skin: Secondary | ICD-10-CM | POA: Diagnosis not present

## 2023-01-26 DIAGNOSIS — Z86018 Personal history of other benign neoplasm: Secondary | ICD-10-CM

## 2023-01-26 DIAGNOSIS — W908XXA Exposure to other nonionizing radiation, initial encounter: Secondary | ICD-10-CM

## 2023-01-26 DIAGNOSIS — L821 Other seborrheic keratosis: Secondary | ICD-10-CM

## 2023-01-26 DIAGNOSIS — L7211 Pilar cyst: Secondary | ICD-10-CM

## 2023-01-26 NOTE — Patient Instructions (Addendum)

## 2023-01-26 NOTE — Progress Notes (Signed)
Follow-Up Visit   Subjective  Jillian Hunter is a 62 y.o. female who presents for the following: Skin Cancer Screening and Full Body Skin Exam  The patient presents for Total-Body Skin Exam (TBSE) for skin cancer screening and mole check. The patient has spots, moles and lesions to be evaluated, some may be new or changing and the patient may have concern these could be cancer.  Hx of dysplastic nevi.   The following portions of the chart were reviewed this encounter and updated as appropriate: medications, allergies, medical history  Review of Systems:  No other skin or systemic complaints except as noted in HPI or Assessment and Plan.  Objective  Well appearing patient in no apparent distress; mood and affect are within normal limits.  A full examination was performed including scalp, head, eyes, ears, nose, lips, neck, chest, axillae, abdomen, back, buttocks, bilateral upper extremities, bilateral lower extremities, hands, feet, fingers, toes, fingernails, and toenails. All findings within normal limits unless otherwise noted below.   Relevant physical exam findings are noted in the Assessment and Plan.  right upper back x 1 Erythematous thin papules/macules with gritty scale.     Assessment & Plan   SKIN CANCER SCREENING PERFORMED TODAY.  ACTINIC DAMAGE - Chronic condition, secondary to cumulative UV/sun exposure - diffuse scaly erythematous macules with underlying dyspigmentation - Recommend daily broad spectrum sunscreen SPF 30+ to sun-exposed areas, reapply every 2 hours as needed.  - Staying in the shade or wearing long sleeves, sun glasses (UVA+UVB protection) and wide brim hats (4-inch brim around the entire circumference of the hat) are also recommended for sun protection.  - Call for new or changing lesions.  LENTIGINES, SEBORRHEIC KERATOSES, HEMANGIOMAS - Benign normal skin lesions - Benign-appearing - Call for any changes  MELANOCYTIC NEVI - Tan-brown  and/or pink-flesh-colored symmetric macules and papules - Benign appearing on exam today - Observation - Call clinic for new or changing moles - Recommend daily use of broad spectrum spf 30+ sunscreen to sun-exposed areas.   History of Dysplastic Nevi - No evidence of recurrence today - Recommend regular full body skin exams - Recommend daily broad spectrum sunscreen SPF 30+ to sun-exposed areas, reapply every 2 hours as needed.  - Call if any new or changing lesions are noted between office visits  Pilar Cyst Exam: Subcutaneous nodule.  Benign-appearing. Exam most consistent with a pilar cyst. Discussed that a cyst is a benign growth that can grow over time and sometimes get irritated or inflamed. Recommend observation if it is not bothersome. Discussed option of surgical excision to remove it if it is growing, symptomatic, or other changes noted. Please call for new or changing lesions so they can be evaluated.  Dermatofibroma A dermatofibroma is a benign growth possibly related to trauma, such as an insect bite, cut from shaving, or inflamed acne-type bump.  Since not bothersome, will observe for now.   AK (actinic keratosis) right upper back x 1  Actinic keratoses are precancerous spots that appear secondary to cumulative UV radiation exposure/sun exposure over time. They are chronic with expected duration over 1 year. A portion of actinic keratoses will progress to squamous cell carcinoma of the skin. It is not possible to reliably predict which spots will progress to skin cancer and so treatment is recommended to prevent development of skin cancer.  Recommend daily broad spectrum sunscreen SPF 30+ to sun-exposed areas, reapply every 2 hours as needed.  Recommend staying in the shade or wearing long  sleeves, sun glasses (UVA+UVB protection) and wide brim hats (4-inch brim around the entire circumference of the hat). Call for new or changing lesions.   Destruction of lesion - right  upper back x 1  Destruction method: cryotherapy   Informed consent: discussed and consent obtained   Lesion destroyed using liquid nitrogen: Yes   Cryotherapy cycles:  2 Outcome: patient tolerated procedure well with no complications   Post-procedure details: wound care instructions given     Return in about 1 year (around 01/26/2024) for TBSE, Hx Dysplastic Nevi.  Anise Salvo, RMA, am acting as scribe for Elie Goody, MD .   Documentation: I have reviewed the above documentation for accuracy and completeness, and I agree with the above.  Elie Goody, MD

## 2023-01-26 NOTE — Telephone Encounter (Signed)
disregard

## 2023-03-22 IMAGING — MG MM DIGITAL SCREENING BILAT W/ TOMO AND CAD
8 series · 8 of 24 positions shown · non-contrast
Comparison: Previous exam(s).

CLINICAL DATA: Screening.

EXAM:
DIGITAL SCREENING BILATERAL MAMMOGRAM WITH TOMOSYNTHESIS AND CAD
TECHNIQUE: Bilateral screening digital craniocaudal and mediolateral oblique
mammograms were obtained. Bilateral screening digital breast
tomosynthesis was performed. The images were evaluated with
computer-aided detection.

[R MLO synth-2D]
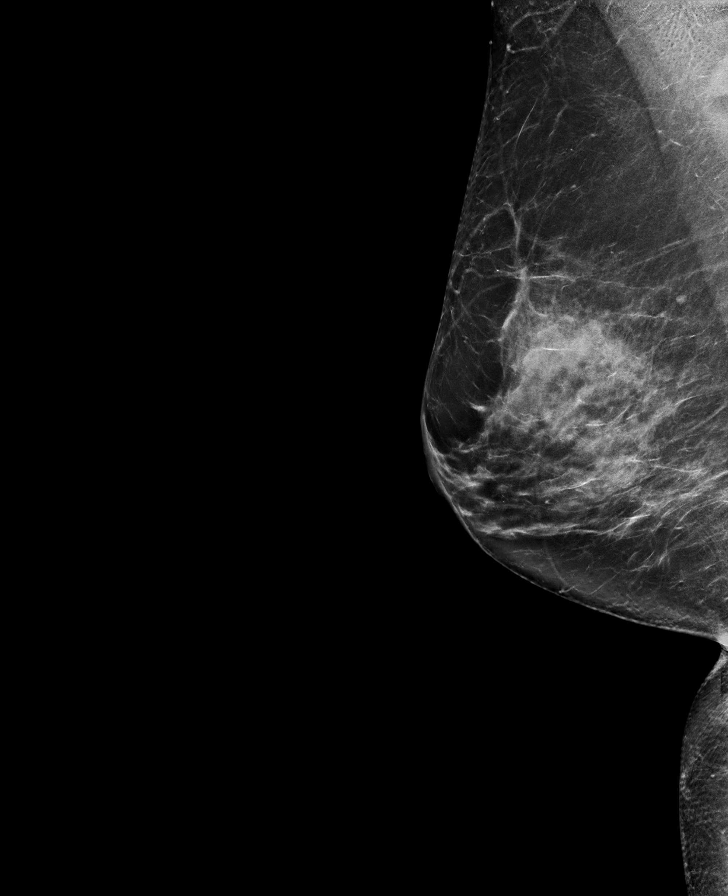

[L MLO synth-2D]
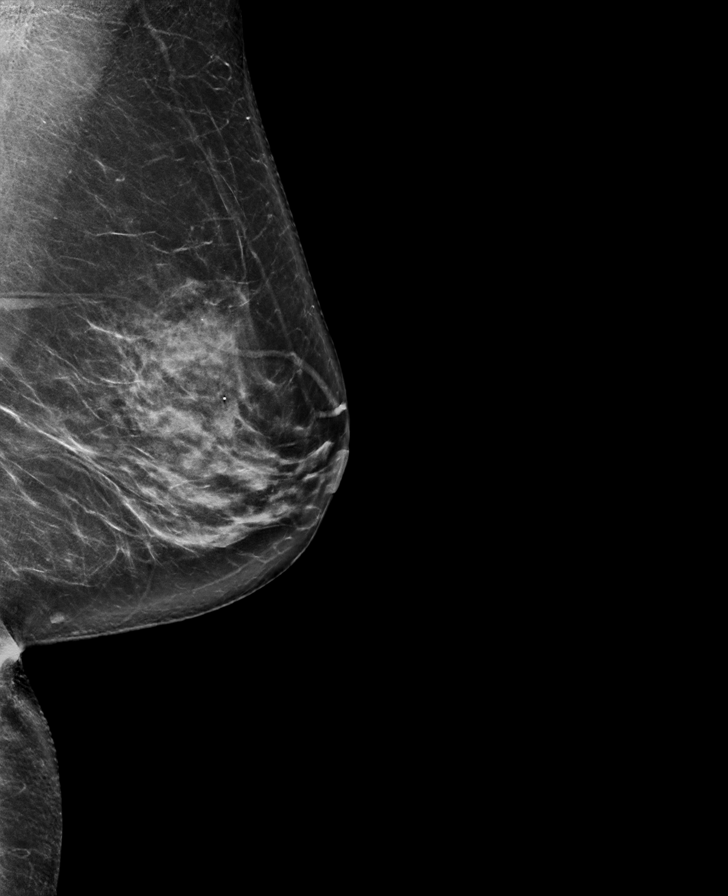

[R CC synth-2D]
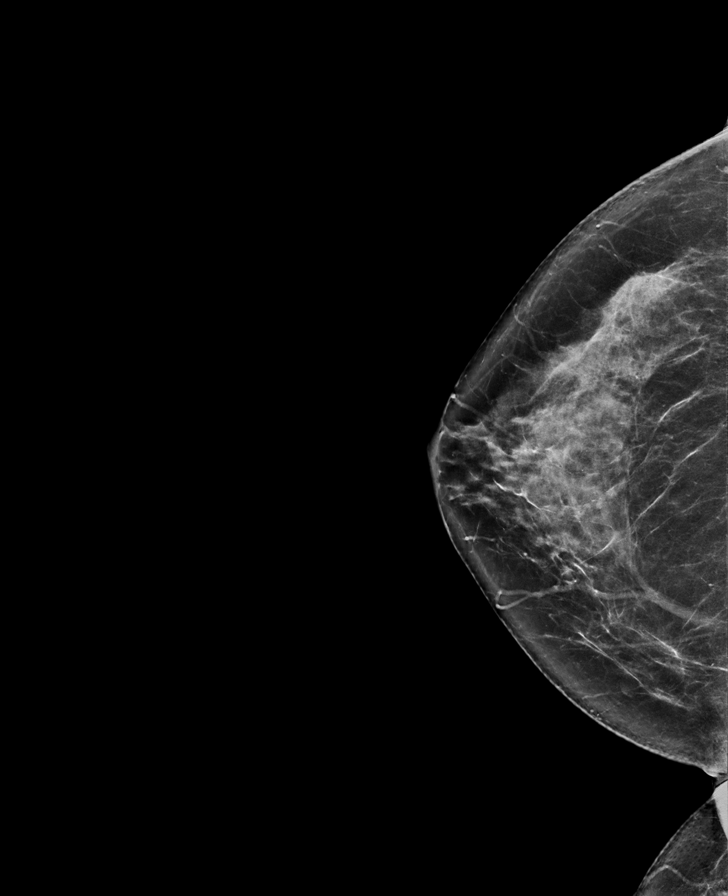

[L CC synth-2D]
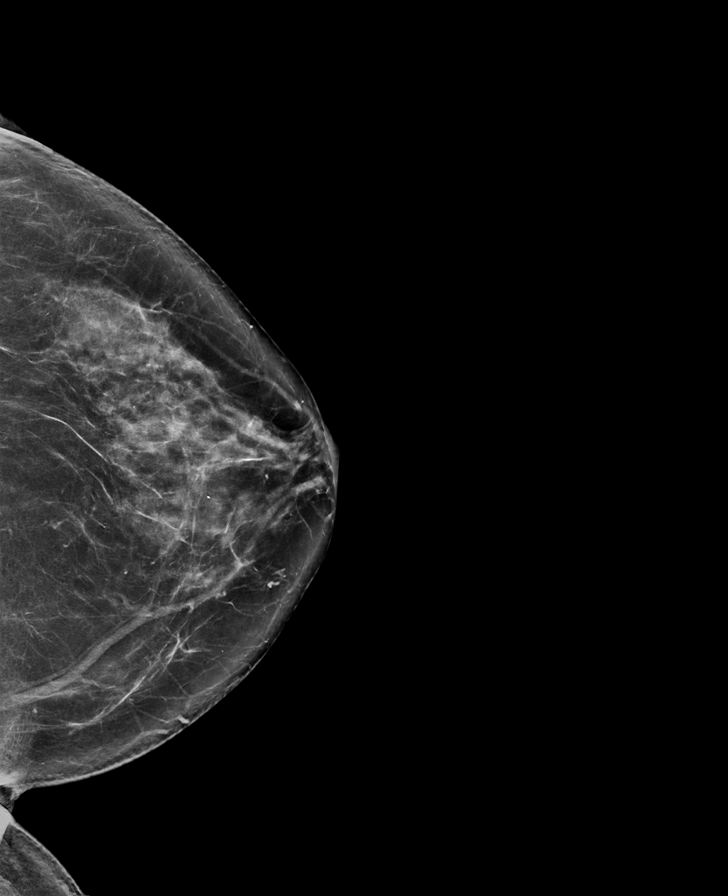

[R CC tomo · tomo slice 40/79.0]
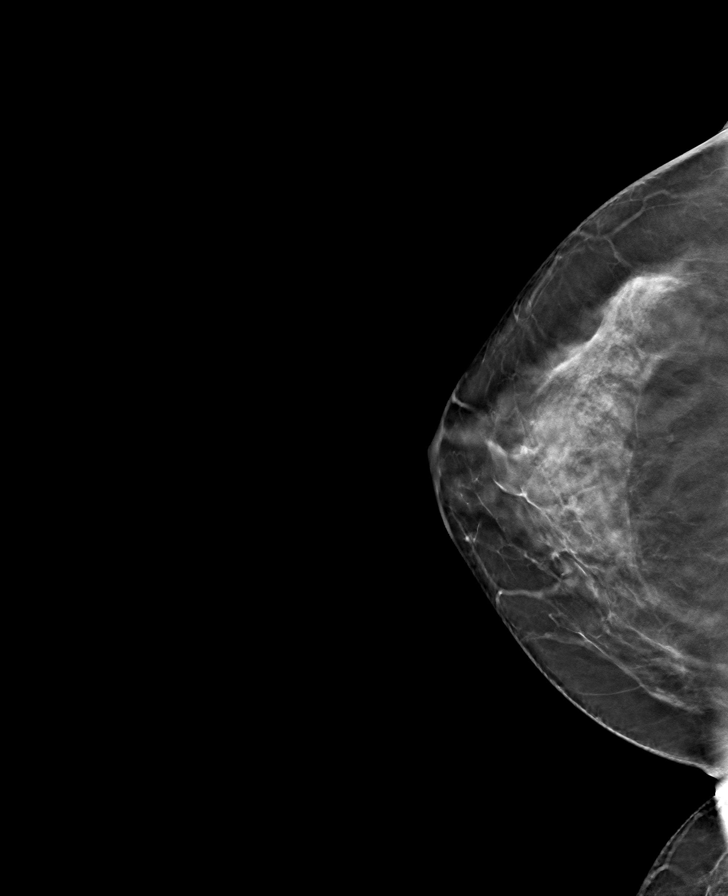

[L CC tomo · tomo slice 41/81.0]
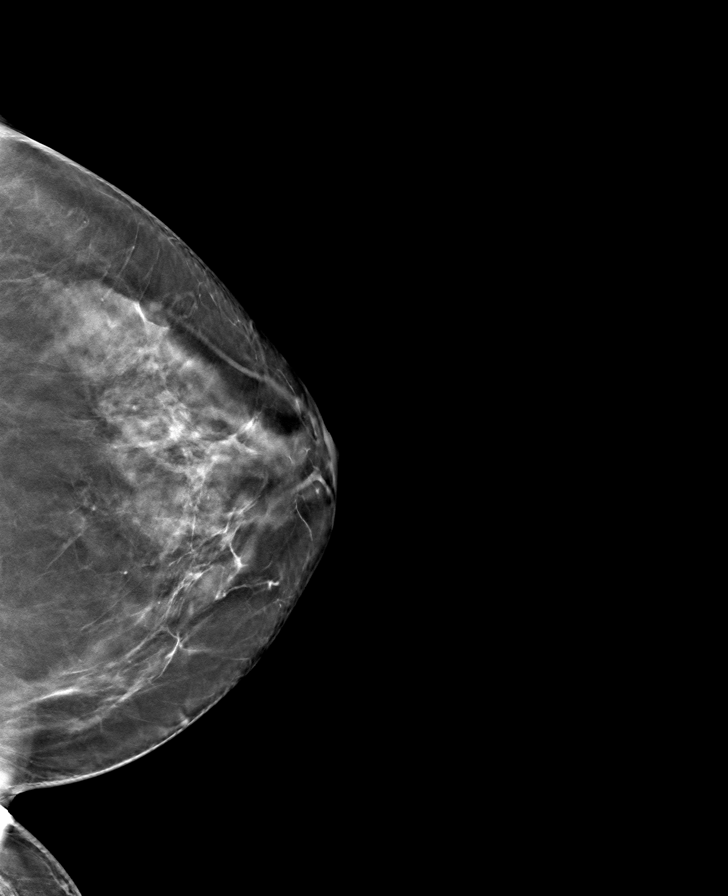

[R MLO tomo · tomo slice 39/78.0]
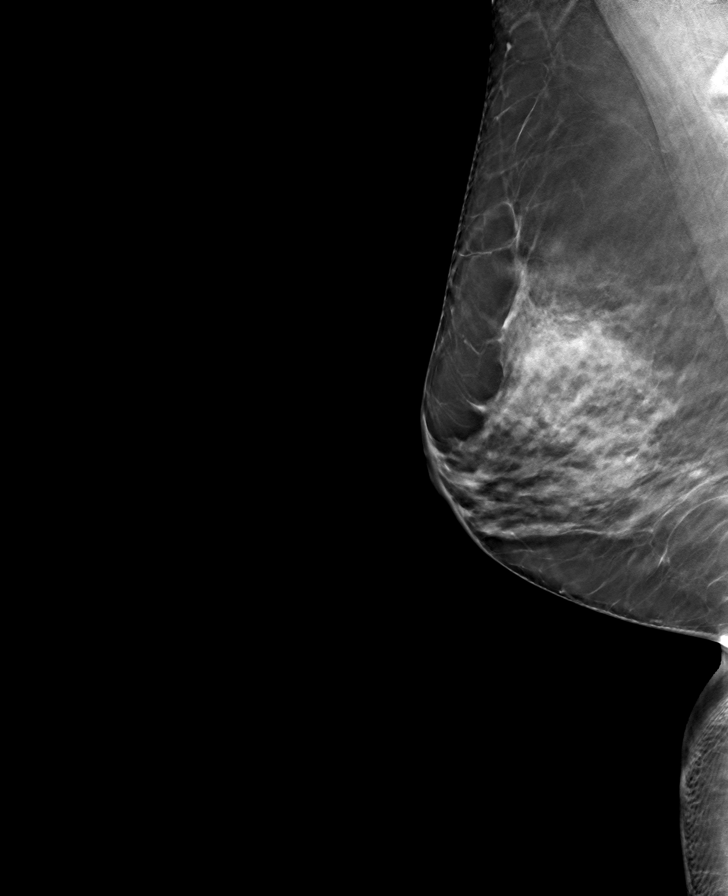

[L MLO tomo · tomo slice 43/85.0]
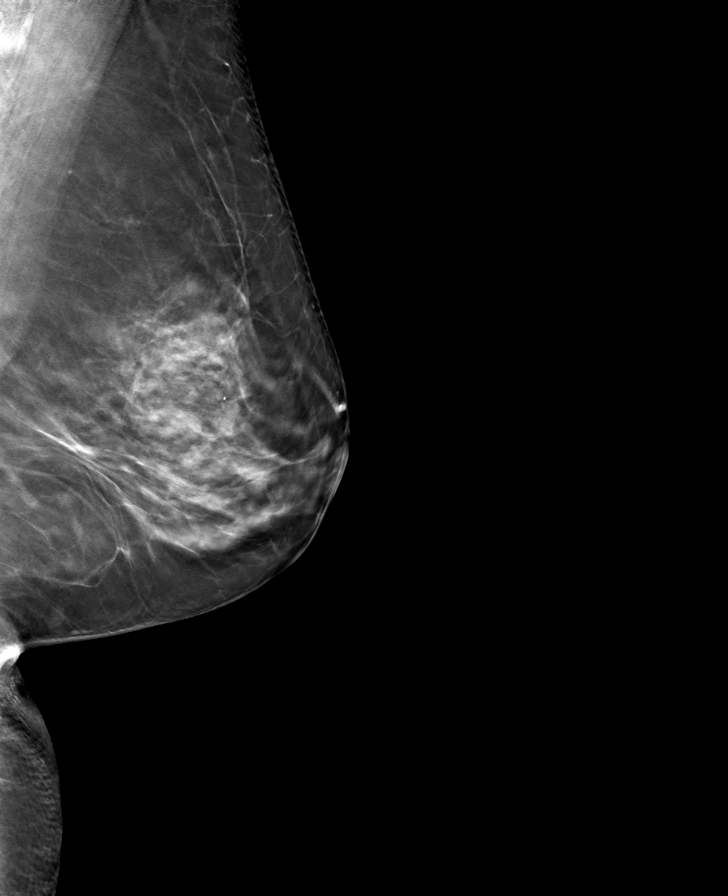

[8 of 24 positions shown; findings below may reference images not displayed]

ACR Breast Density Category c: The breast tissue is heterogeneously
dense, which may obscure small masses.
FINDINGS: There are no findings suspicious for malignancy.
IMPRESSION: No mammographic evidence of malignancy. A result letter of this
screening mammogram will be mailed directly to the patient.

RECOMMENDATION:
Screening mammogram in one year. (Code:Q3-W-BC3)

BI-RADS CATEGORY  1: Negative.

## 2023-03-26 ENCOUNTER — Other Ambulatory Visit: Payer: Self-pay | Admitting: Family Medicine

## 2023-03-26 DIAGNOSIS — Z1231 Encounter for screening mammogram for malignant neoplasm of breast: Secondary | ICD-10-CM

## 2023-04-08 ENCOUNTER — Encounter: Payer: Managed Care, Other (non HMO) | Admitting: Dermatology

## 2023-05-04 ENCOUNTER — Ambulatory Visit
Admission: RE | Admit: 2023-05-04 | Discharge: 2023-05-04 | Disposition: A | Discharge status: Home / Self Care | Payer: Managed Care, Other (non HMO) | Source: Ambulatory Visit | Attending: Family Medicine | Admitting: Family Medicine

## 2023-05-04 DIAGNOSIS — Z1231 Encounter for screening mammogram for malignant neoplasm of breast: Secondary | ICD-10-CM | POA: Diagnosis present

## 2023-10-25 ENCOUNTER — Ambulatory Visit: Payer: Self-pay

## 2023-10-25 DIAGNOSIS — K640 First degree hemorrhoids: Secondary | ICD-10-CM

## 2023-10-25 DIAGNOSIS — Z1211 Encounter for screening for malignant neoplasm of colon: Secondary | ICD-10-CM

## 2024-01-27 ENCOUNTER — Ambulatory Visit: Payer: Managed Care, Other (non HMO) | Admitting: Dermatology

## 2024-02-15 ENCOUNTER — Encounter: Payer: Self-pay | Admitting: Dermatology

## 2024-02-15 ENCOUNTER — Ambulatory Visit: Admitting: Dermatology

## 2024-02-15 DIAGNOSIS — L814 Other melanin hyperpigmentation: Secondary | ICD-10-CM

## 2024-02-15 DIAGNOSIS — L820 Inflamed seborrheic keratosis: Secondary | ICD-10-CM | POA: Diagnosis not present

## 2024-02-15 DIAGNOSIS — Z1283 Encounter for screening for malignant neoplasm of skin: Secondary | ICD-10-CM

## 2024-02-15 DIAGNOSIS — D1801 Hemangioma of skin and subcutaneous tissue: Secondary | ICD-10-CM | POA: Diagnosis not present

## 2024-02-15 DIAGNOSIS — Z86018 Personal history of other benign neoplasm: Secondary | ICD-10-CM

## 2024-02-15 DIAGNOSIS — L578 Other skin changes due to chronic exposure to nonionizing radiation: Secondary | ICD-10-CM

## 2024-02-15 DIAGNOSIS — D225 Melanocytic nevi of trunk: Secondary | ICD-10-CM | POA: Diagnosis not present

## 2024-02-15 DIAGNOSIS — D229 Melanocytic nevi, unspecified: Secondary | ICD-10-CM

## 2024-02-15 DIAGNOSIS — D485 Neoplasm of uncertain behavior of skin: Secondary | ICD-10-CM

## 2024-02-15 DIAGNOSIS — W908XXA Exposure to other nonionizing radiation, initial encounter: Secondary | ICD-10-CM | POA: Diagnosis not present

## 2024-02-15 DIAGNOSIS — L821 Other seborrheic keratosis: Secondary | ICD-10-CM

## 2024-02-15 DIAGNOSIS — S8012XA Contusion of left lower leg, initial encounter: Secondary | ICD-10-CM

## 2024-02-15 DIAGNOSIS — D492 Neoplasm of unspecified behavior of bone, soft tissue, and skin: Secondary | ICD-10-CM

## 2024-02-15 NOTE — Patient Instructions (Addendum)

## 2024-02-15 NOTE — Progress Notes (Signed)
 Follow-Up Visit   Subjective  Jillian Hunter is a 63 y.o. female who presents for the following: Skin Cancer Screening and Full Body Skin Exam, hx of Dysplastic nevus, patient c/o several spot legs and chest changing and growing.    The patient presents for Total-Body Skin Exam (TBSE) for skin cancer screening and mole check. The patient has spots, moles and lesions to be evaluated, some may be new or changing and the patient may have concern these could be cancer.    The following portions of the chart were reviewed this encounter and updated as appropriate: medications, allergies, medical history  Review of Systems:  No other skin or systemic complaints except as noted in HPI or Assessment and Plan.  Objective  Well appearing patient in no apparent distress; mood and affect are within normal limits.  A full examination was performed including scalp, head, eyes, ears, nose, lips, neck, chest, axillae, abdomen, back, buttocks, bilateral upper extremities, bilateral lower extremities, hands, feet, fingers, toes, fingernails, and toenails. All findings within normal limits unless otherwise noted below.   Relevant physical exam findings are noted in the Assessment and Plan.  intermammary chest 0.6 cm pigmented macule   Right Lower Leg - Anterior 0.2 cm red vascular papule   intermammary chest x 12 (12) Stuck-on, waxy, tan-brown papules and plaques -- Discussed benign etiology and prognosis.   Assessment & Plan   SKIN CANCER SCREENING PERFORMED TODAY.  ACTINIC DAMAGE - Chronic condition, secondary to cumulative UV/sun exposure - diffuse scaly erythematous macules with underlying dyspigmentation - Recommend daily broad spectrum sunscreen SPF 30+ to sun-exposed areas, reapply every 2 hours as needed.  - Staying in the shade or wearing long sleeves, sun glasses (UVA+UVB protection) and wide brim hats (4-inch brim around the entire circumference of the hat) are also recommended for  sun protection.  - Call for new or changing lesions.  LENTIGINES, SEBORRHEIC KERATOSES, HEMANGIOMAS - Benign normal skin lesions - Benign-appearing - Call for any changes  MELANOCYTIC NEVI - Tan-brown and/or pink-flesh-colored symmetric macules and papules - Benign appearing on exam today - Observation - Call clinic for new or changing moles - Recommend daily use of broad spectrum spf 30+ sunscreen to sun-exposed areas.    History of Dysplastic Nevi - No evidence of recurrence today - Recommend regular full body skin exams - Recommend daily broad spectrum sunscreen SPF 30+ to sun-exposed areas, reapply every 2 hours as needed.  - Call if any new or changing lesions are noted between office visits  BRUISE Left lower leg Collection of blood vessels Observe  If not gone in 4-6 weeks return to the office for biopsy    NEOPLASM OF SKIN (2) intermammary chest Skin / nail biopsy Type of biopsy: tangential   Informed consent: discussed and consent obtained   Patient was prepped and draped in usual sterile fashion: area prepped with alochol. Anesthesia: the lesion was anesthetized in a standard fashion   Anesthetic:  1% lidocaine  w/ epinephrine  1-100,000 buffered w/ 8.4% NaHCO3 Instrument used: flexible razor blade   Hemostasis achieved with: pressure, aluminum chloride and electrodesiccation   Outcome: patient tolerated procedure well   Post-procedure details: wound care instructions given   Post-procedure details comment:  Ointment and small bandage  Specimen 1 - Surgical pathology Differential Diagnosis: R/O Dysplastic nevus vs Melanoma   Check Margins: No Right Lower Leg - Anterior Skin / nail biopsy Type of biopsy: tangential   Informed consent: discussed and consent obtained  Patient was prepped and draped in usual sterile fashion: area prepped with alochol. Anesthesia: the lesion was anesthetized in a standard fashion   Anesthetic:  1% lidocaine  w/ epinephrine   1-100,000 buffered w/ 8.4% NaHCO3 Instrument used: flexible razor blade   Hemostasis achieved with: pressure, aluminum chloride and electrodesiccation   Outcome: patient tolerated procedure well   Post-procedure details: wound care instructions given   Post-procedure details comment:  Ointment and small bandage  Specimen 2 - Surgical pathology Differential Diagnosis: R/O Hemangioma   Check Margins: No INFLAMED SEBORRHEIC KERATOSIS (12) intermammary chest x 12 (12) Symptomatic, irritating, patient would like treated.  Destruction of lesion - intermammary chest x 12 (12) Complexity: simple   Destruction method: cryotherapy   Informed consent: discussed and consent obtained   Timeout:  patient name, date of birth, surgical site, and procedure verified Lesion destroyed using liquid nitrogen: Yes   Region frozen until ice ball extended beyond lesion: Yes   Outcome: patient tolerated procedure well with no complications   Post-procedure details: wound care instructions given    MULTIPLE BENIGN NEVI   LENTIGINES   ACTINIC ELASTOSIS   SEBORRHEIC KERATOSES   CHERRY ANGIOMA      Return in about 1 year (around 02/14/2025) for TBSE, hx of Dysplastic nevus .  IFay Kirks, CMA, am acting as scribe for Boneta Sharps, MD .   Documentation: I have reviewed the above documentation for accuracy and completeness, and I agree with the above.  Boneta Sharps, MD

## 2024-02-18 LAB — SURGICAL PATHOLOGY

## 2024-02-21 ENCOUNTER — Encounter: Payer: Self-pay | Admitting: Dermatology

## 2024-02-21 ENCOUNTER — Ambulatory Visit: Payer: Self-pay | Admitting: Dermatology

## 2024-02-21 NOTE — Telephone Encounter (Signed)
-----   Message from [facility] sent at 02/21/2024  9:03 AM EDT ----- Diagnosis: 1. 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 Skin, right lower leg - anterior :       HEMANGIOMA   Plan: sent mychart ----- Message ----- From: Interface, [facility] Sent: 02/18/2024   5:50 PM EDT To: Boneta Sharps, MD

## 2024-02-21 NOTE — Telephone Encounter (Signed)
 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

## 2024-02-21 NOTE — Telephone Encounter (Signed)
Left message for patient to return call regarding results  

## 2024-04-06 ENCOUNTER — Other Ambulatory Visit: Payer: Self-pay | Admitting: Family Medicine

## 2024-04-06 DIAGNOSIS — Z1231 Encounter for screening mammogram for malignant neoplasm of breast: Secondary | ICD-10-CM

## 2024-05-09 ENCOUNTER — Encounter

## 2024-05-09 ENCOUNTER — Ambulatory Visit
Admission: RE | Admit: 2024-05-09 | Discharge: 2024-05-09 | Disposition: A | Discharge status: Home / Self Care | Source: Ambulatory Visit | Attending: Family Medicine | Admitting: Family Medicine

## 2024-05-09 DIAGNOSIS — Z1231 Encounter for screening mammogram for malignant neoplasm of breast: Secondary | ICD-10-CM | POA: Diagnosis present

## 2025-02-15 ENCOUNTER — Encounter: Admitting: Dermatology
